# Patient Record
Sex: Male | Born: 1960 | Race: Black or African American | Hispanic: No | Marital: Single | Smoking: Former smoker
Health system: Southern US, Community
[De-identification: ages and names within clinical notes are randomized; demographics above are authoritative.]

## PROBLEM LIST (undated history)

## (undated) DIAGNOSIS — I639 Cerebral infarction, unspecified: Secondary | ICD-10-CM

## (undated) DIAGNOSIS — E119 Type 2 diabetes mellitus without complications: Secondary | ICD-10-CM

## (undated) DIAGNOSIS — I10 Essential (primary) hypertension: Secondary | ICD-10-CM

## (undated) HISTORY — PX: KNEE SURGERY: SHX244

---

## 2005-09-13 ENCOUNTER — Emergency Department: Payer: Self-pay | Admitting: Internal Medicine

## 2007-07-16 ENCOUNTER — Other Ambulatory Visit: Payer: Self-pay

## 2007-07-16 ENCOUNTER — Emergency Department: Payer: Self-pay | Admitting: Emergency Medicine

## 2009-06-02 ENCOUNTER — Emergency Department: Payer: Self-pay | Admitting: Emergency Medicine

## 2009-06-05 ENCOUNTER — Ambulatory Visit: Payer: Self-pay | Admitting: Orthopedic Surgery

## 2009-06-29 ENCOUNTER — Emergency Department: Payer: Self-pay | Admitting: Emergency Medicine

## 2015-04-09 ENCOUNTER — Encounter
Admission: RE | Disposition: A | Discharge status: Home / Self Care | Payer: Self-pay | Source: Ambulatory Visit | Attending: Internal Medicine

## 2015-04-09 ENCOUNTER — Ambulatory Visit
Admission: RE | Admit: 2015-04-09 | Discharge: 2015-04-09 | Disposition: A | Discharge status: Home / Self Care | Payer: Medicare Other | Source: Ambulatory Visit | Attending: Internal Medicine | Admitting: Internal Medicine

## 2015-04-09 ENCOUNTER — Encounter: Payer: Self-pay | Admitting: *Deleted

## 2015-04-09 DIAGNOSIS — Z87891 Personal history of nicotine dependence: Secondary | ICD-10-CM | POA: Diagnosis not present

## 2015-04-09 DIAGNOSIS — Z79899 Other long term (current) drug therapy: Secondary | ICD-10-CM | POA: Insufficient documentation

## 2015-04-09 DIAGNOSIS — I259 Chronic ischemic heart disease, unspecified: Secondary | ICD-10-CM | POA: Insufficient documentation

## 2015-04-09 DIAGNOSIS — I2510 Atherosclerotic heart disease of native coronary artery without angina pectoris: Secondary | ICD-10-CM | POA: Diagnosis present

## 2015-04-09 DIAGNOSIS — E785 Hyperlipidemia, unspecified: Secondary | ICD-10-CM | POA: Diagnosis not present

## 2015-04-09 DIAGNOSIS — E119 Type 2 diabetes mellitus without complications: Secondary | ICD-10-CM | POA: Diagnosis not present

## 2015-04-09 DIAGNOSIS — I25119 Atherosclerotic heart disease of native coronary artery with unspecified angina pectoris: Secondary | ICD-10-CM | POA: Diagnosis not present

## 2015-04-09 DIAGNOSIS — I10 Essential (primary) hypertension: Secondary | ICD-10-CM | POA: Diagnosis not present

## 2015-04-09 DIAGNOSIS — I200 Unstable angina: Secondary | ICD-10-CM | POA: Diagnosis present

## 2015-04-09 DIAGNOSIS — Z7982 Long term (current) use of aspirin: Secondary | ICD-10-CM | POA: Insufficient documentation

## 2015-04-09 DIAGNOSIS — E7800 Pure hypercholesterolemia, unspecified: Secondary | ICD-10-CM | POA: Diagnosis not present

## 2015-04-09 DIAGNOSIS — Z7984 Long term (current) use of oral hypoglycemic drugs: Secondary | ICD-10-CM | POA: Diagnosis not present

## 2015-04-09 DIAGNOSIS — R9439 Abnormal result of other cardiovascular function study: Secondary | ICD-10-CM | POA: Diagnosis not present

## 2015-04-09 HISTORY — DX: Essential (primary) hypertension: I10

## 2015-04-09 HISTORY — PX: CARDIAC CATHETERIZATION: SHX172

## 2015-04-09 HISTORY — DX: Type 2 diabetes mellitus without complications: E11.9

## 2015-04-09 HISTORY — DX: Cerebral infarction, unspecified: I63.9

## 2015-04-09 LAB — GLUCOSE, CAPILLARY: Glucose-Capillary: 164 mg/dL — ABNORMAL HIGH (ref 65–99)

## 2015-04-09 SURGERY — LEFT HEART CATH AND CORONARY ANGIOGRAPHY
Anesthesia: Moderate Sedation

## 2015-04-09 MED ORDER — IOHEXOL 300 MG/ML  SOLN
INTRAMUSCULAR | Status: DC | PRN
  Administered 2015-04-09: 160 mL via INTRA_ARTERIAL

## 2015-04-09 MED ORDER — MIDAZOLAM HCL 2 MG/2ML IJ SOLN
INTRAMUSCULAR | Status: AC
  Filled 2015-04-09: qty 2

## 2015-04-09 MED ORDER — SODIUM CHLORIDE 0.9 % IV SOLN
INTRAVENOUS | Status: DC
Start: 2015-04-09 — End: 2015-04-09
  Administered 2015-04-09: 12:00:00 via INTRAVENOUS

## 2015-04-09 MED ORDER — SODIUM CHLORIDE 0.9 % IV SOLN: 250.0000 mL | INTRAVENOUS | Status: DC | PRN

## 2015-04-09 MED ORDER — AMLODIPINE BESYLATE 5 MG PO TABS
ORAL_TABLET | ORAL | Status: AC
  Administered 2015-04-09: 10 mg
  Filled 2015-04-09: qty 2

## 2015-04-09 MED ORDER — CARVEDILOL 25 MG PO TABS
25.0000 mg | ORAL_TABLET | Freq: Two times a day (BID) | ORAL | Status: DC
  Administered 2015-04-09: 25 mg via ORAL
  Filled 2015-04-09 (×2): qty 1

## 2015-04-09 MED ORDER — FENTANYL CITRATE (PF) 100 MCG/2ML IJ SOLN
INTRAMUSCULAR | Status: AC
  Filled 2015-04-09: qty 2

## 2015-04-09 MED ORDER — HEPARIN (PORCINE) IN NACL 2-0.9 UNIT/ML-% IJ SOLN
INTRAMUSCULAR | Status: AC
  Filled 2015-04-09: qty 500

## 2015-04-09 MED ORDER — SODIUM CHLORIDE 0.9% FLUSH: 3.0000 mL | INTRAVENOUS | Status: DC | PRN

## 2015-04-09 MED ORDER — AMLODIPINE BESYLATE 10 MG PO TABS
10.0000 mg | ORAL_TABLET | Freq: Every day | ORAL | Status: DC
  Filled 2015-04-09 (×2): qty 1

## 2015-04-09 MED ORDER — LABETALOL HCL 5 MG/ML IV SOLN
INTRAVENOUS | Status: DC | PRN
  Administered 2015-04-09: 20 mg via INTRAVENOUS

## 2015-04-09 MED ORDER — FENTANYL CITRATE (PF) 100 MCG/2ML IJ SOLN
INTRAMUSCULAR | Status: DC | PRN
  Administered 2015-04-09: 25 ug via INTRAVENOUS

## 2015-04-09 MED ORDER — MIDAZOLAM HCL 2 MG/2ML IJ SOLN
INTRAMUSCULAR | Status: DC | PRN
  Administered 2015-04-09 (×2): 1 mg via INTRAVENOUS

## 2015-04-09 MED ORDER — SODIUM CHLORIDE 0.9% FLUSH: 3.0000 mL | Freq: Two times a day (BID) | INTRAVENOUS | Status: DC

## 2015-04-09 MED ORDER — LABETALOL HCL 5 MG/ML IV SOLN
INTRAVENOUS | Status: AC
  Filled 2015-04-09: qty 4

## 2015-04-09 SURGICAL SUPPLY — 15 items
CATH INFINITI 5 FR AL2 (CATHETERS) ×3 IMPLANT
CATH INFINITI 5 FR MPA2 (CATHETERS) ×3 IMPLANT
CATH INFINITI 5FR ANG PIGTAIL (CATHETERS) ×3 IMPLANT
CATH INFINITI 5FR JL4 (CATHETERS) ×3 IMPLANT
CATH INFINITI 5FR JL5 (CATHETERS) ×3 IMPLANT
CATH INFINITI JR4 5F (CATHETERS) ×3 IMPLANT
DEVICE CLOSURE MYNXGRIP 6/7F (Vascular Products) ×3 IMPLANT
KIT MANI 3VAL PERCEP (MISCELLANEOUS) ×3 IMPLANT
NEEDLE PERC 18GX7CM (NEEDLE) ×3 IMPLANT
PACK CARDIAC CATH (CUSTOM PROCEDURE TRAY) ×3 IMPLANT
SHEATH BRITE TIP 6FR X 23 (SHEATH) ×3 IMPLANT
SHEATH PINNACLE 5F 10CM (SHEATH) ×3 IMPLANT
SHEATH PINNACLE 6F 10CM (SHEATH) ×3 IMPLANT
WIRE EMERALD 3MM-J .035X150CM (WIRE) ×3 IMPLANT
WIRE EMERALD 3MM-J .035X260CM (WIRE) ×3 IMPLANT

## 2015-04-09 NOTE — Discharge Instructions (Signed)

## 2015-04-10 ENCOUNTER — Encounter: Payer: Self-pay | Admitting: Internal Medicine

## 2016-12-12 ENCOUNTER — Inpatient Hospital Stay
Admission: EM | Admit: 2016-12-12 | Discharge: 2016-12-13 | DRG: 065 | Disposition: A | Discharge status: Other Acute Inpatient Hospital | Payer: Medicare Other | Attending: Internal Medicine | Admitting: Internal Medicine

## 2016-12-12 ENCOUNTER — Emergency Department: Payer: Medicare Other

## 2016-12-12 DIAGNOSIS — G8194 Hemiplegia, unspecified affecting left nondominant side: Secondary | ICD-10-CM | POA: Diagnosis present

## 2016-12-12 DIAGNOSIS — I10 Essential (primary) hypertension: Secondary | ICD-10-CM | POA: Diagnosis present

## 2016-12-12 DIAGNOSIS — Z79899 Other long term (current) drug therapy: Secondary | ICD-10-CM

## 2016-12-12 DIAGNOSIS — W010XXA Fall on same level from slipping, tripping and stumbling without subsequent striking against object, initial encounter: Secondary | ICD-10-CM | POA: Diagnosis present

## 2016-12-12 DIAGNOSIS — I63531 Cerebral infarction due to unspecified occlusion or stenosis of right posterior cerebral artery: Secondary | ICD-10-CM | POA: Diagnosis not present

## 2016-12-12 DIAGNOSIS — Y92009 Unspecified place in unspecified non-institutional (private) residence as the place of occurrence of the external cause: Secondary | ICD-10-CM

## 2016-12-12 DIAGNOSIS — Z87891 Personal history of nicotine dependence: Secondary | ICD-10-CM

## 2016-12-12 DIAGNOSIS — H538 Other visual disturbances: Secondary | ICD-10-CM | POA: Diagnosis present

## 2016-12-12 DIAGNOSIS — R001 Bradycardia, unspecified: Secondary | ICD-10-CM | POA: Diagnosis present

## 2016-12-12 DIAGNOSIS — I639 Cerebral infarction, unspecified: Secondary | ICD-10-CM | POA: Diagnosis not present

## 2016-12-12 DIAGNOSIS — Z5309 Procedure and treatment not carried out because of other contraindication: Secondary | ICD-10-CM

## 2016-12-12 DIAGNOSIS — E785 Hyperlipidemia, unspecified: Secondary | ICD-10-CM | POA: Diagnosis present

## 2016-12-12 DIAGNOSIS — R414 Neurologic neglect syndrome: Secondary | ICD-10-CM | POA: Diagnosis present

## 2016-12-12 DIAGNOSIS — I671 Cerebral aneurysm, nonruptured: Secondary | ICD-10-CM | POA: Diagnosis present

## 2016-12-12 DIAGNOSIS — R29712 NIHSS score 12: Secondary | ICD-10-CM | POA: Diagnosis present

## 2016-12-12 DIAGNOSIS — Z7984 Long term (current) use of oral hypoglycemic drugs: Secondary | ICD-10-CM

## 2016-12-12 DIAGNOSIS — E119 Type 2 diabetes mellitus without complications: Secondary | ICD-10-CM | POA: Diagnosis present

## 2016-12-12 LAB — CBC WITH DIFFERENTIAL/PLATELET
Basophils Absolute: 0 10*3/uL (ref 0–0.1)
Basophils Relative: 0 %
Eosinophils Absolute: 0 10*3/uL (ref 0–0.7)
Eosinophils Relative: 0 %
HEMATOCRIT: 43.9 % (ref 40.0–52.0)
HEMOGLOBIN: 14.3 g/dL (ref 13.0–18.0)
LYMPHS ABS: 0.6 10*3/uL — AB (ref 1.0–3.6)
Lymphocytes Relative: 5 %
MCH: 25.3 pg — AB (ref 26.0–34.0)
MCHC: 32.6 g/dL (ref 32.0–36.0)
MCV: 77.6 fL — AB (ref 80.0–100.0)
MONO ABS: 0.6 10*3/uL (ref 0.2–1.0)
Monocytes Relative: 5 %
NEUTROS ABS: 11.8 10*3/uL — AB (ref 1.4–6.5)
NEUTROS PCT: 90 %
Platelets: 179 10*3/uL (ref 150–440)
RBC: 5.66 MIL/uL (ref 4.40–5.90)
RDW: 15.5 % — AB (ref 11.5–14.5)
WBC: 13.1 10*3/uL — ABNORMAL HIGH (ref 3.8–10.6)

## 2016-12-12 LAB — COMPREHENSIVE METABOLIC PANEL
ALT: 23 U/L (ref 17–63)
AST: 22 U/L (ref 15–41)
Albumin: 4.1 g/dL (ref 3.5–5.0)
Alkaline Phosphatase: 69 U/L (ref 38–126)
Anion gap: 11 (ref 5–15)
BILIRUBIN TOTAL: 1 mg/dL (ref 0.3–1.2)
BUN: 11 mg/dL (ref 6–20)
CALCIUM: 9.4 mg/dL (ref 8.9–10.3)
CO2: 26 mmol/L (ref 22–32)
Chloride: 103 mmol/L (ref 101–111)
Creatinine, Ser: 0.95 mg/dL (ref 0.61–1.24)
GLUCOSE: 164 mg/dL — AB (ref 65–99)
POTASSIUM: 3.7 mmol/L (ref 3.5–5.1)
Sodium: 140 mmol/L (ref 135–145)
Total Protein: 8 g/dL (ref 6.5–8.1)

## 2016-12-12 LAB — CK: Total CK: 161 U/L (ref 49–397)

## 2016-12-12 MED ORDER — ASPIRIN 81 MG PO CHEW
324.0000 mg | CHEWABLE_TABLET | Freq: Once | ORAL | Status: AC
  Administered 2016-12-12: 324 mg via ORAL
  Filled 2016-12-12: qty 4

## 2016-12-12 MED ORDER — LABETALOL HCL 5 MG/ML IV SOLN
10.0000 mg | Freq: Once | INTRAVENOUS | Status: DC
  Filled 2016-12-12: qty 4

## 2016-12-12 NOTE — ED Notes (Addendum)
Pt given po fluids and peanut butter crackers as allowed by Dr Mayford Knife; pt complaining about how long it's taking to "get my room"; pt understands admitting MD will be in ASAP; children remain at bedside;

## 2016-12-12 NOTE — ED Notes (Signed)
Patient transported to CT via stretcher with Jon Brown, radiology

## 2016-12-12 NOTE — ED Triage Notes (Addendum)
EMS pt from home after a neighbor saw him laying on the floor through a window at his house; pt can recall coming home from church around noon; says he went into the kitchen to make chicken and got dizzy/fell; pt unsure exactly how long he was on the floor; pt c/o pain to the right side of his head; says it's "like a hunger pain"; answering questions in complete coherent sentences; pt with right sided gaze and right sided weakness which is result of prior CVA; pt arrived incontinent of urine; EMS reported pt told them he has not been taking medications as ordered;

## 2016-12-12 NOTE — ED Notes (Signed)
In talking with pt while inserting IV, pt says he slipped and fell in the kitchen but was also dizzy; pt says he really has no idea what time it was, but he had gotten home from church around noon and went to cook chicken not much longer after that; pt says he can also recall laying on the floor, being unable to get up and had to void on himself;

## 2016-12-12 NOTE — ED Provider Notes (Addendum)
[facility] Emergency Department Provider Note       Time seen: ----------------------------------------- 9:24 PM on 12/12/2016 -----------------------------------------     I have reviewed the triage vital signs and the nursing notes.   HISTORY   Chief Complaint No chief complaint on file.    HPI Jon Brown is a 56 y.o. male with a history of diabetes, high blood pressure and CVA who presents to the ED for a fall. Patient was found on the floor of his home where he attempted to go to the bathroom. Patient denies any complaints at this time. He does a history of CVA with resulting left-sided weakness. Patient states he has stopped taking his pressure medication several months ago when he was found to be very hypertensive in route. He does describe a small frontal headache but otherwise denies complaints this time and arrives oriented.  Past Medical History:  Diagnosis Date  . Diabetes mellitus without complication (HCC)   . Hypertension   . Stroke [facility])     Patient Active Problem List   Diagnosis Date Noted  . Unstable angina (HCC) 04/09/2015    Past Surgical History:  Procedure Laterality Date  . CARDIAC CATHETERIZATION N/A 04/09/2015   Procedure: Left Heart Cath and Coronary Angiography;  Surgeon: Lamar Blinks, MD;  Location: [facility] INVASIVE CV LAB;  Service: Cardiovascular;  Laterality: N/A;  . KNEE SURGERY Bilateral     Allergies Patient has no known allergies.  Social History Social History  Substance Use Topics  . Smoking status: Former Smoker    Packs/day: 1.00    Years: 25.00    Types: Cigars  . Smokeless tobacco: Not on file  . Alcohol use Not on file    Review of Systems Constitutional: Negative for fever. Eyes: Negative for vision changes ENT:  Negative for congestion, sore throat Cardiovascular: Negative for chest pain. Respiratory: Negative for shortness of breath. Gastrointestinal: Negative for abdominal pain,  vomiting and diarrhea. Genitourinary: Negative for dysuria. Musculoskeletal: Negative for back pain. Skin: Negative for rash. Neurological: positive for headache, weakness  All systems negative/normal/unremarkable except as stated in the HPI  ____________________________________________   PHYSICAL EXAM:  VITAL SIGNS: ED Triage Vitals  Enc Vitals Group     BP      Pulse      Resp      Temp      Temp src      SpO2      Weight      Height      Head Circumference      Peak Flow      Pain Score      Pain Loc      Pain Edu?      Excl. in GC?     Constitutional: Alert and oriented. Well appearing and in no distress. Eyes: Conjunctivae are normal.right-sided gaze preference ENT   Head: Normocephalic and atraumatic.   Nose: No congestion/rhinnorhea.   Mouth/Throat: Mucous membranes are moist.   Neck: No stridor. Cardiovascular: Normal rate, regular rhythm. No murmurs, rubs, or gallops. Respiratory: Normal respiratory effort without tachypnea nor retractions. Breath sounds are clear and equal bilaterally. No wheezes/rales/rhonchi. Gastrointestinal: Soft and nontender. Normal bowel sounds Musculoskeletal: Nontender with normal range of motion in extremities. No lower extremity tenderness nor edema. Neurologic:  Normal speech and language. left-sided weakness is notedin the arm and the leg. Possible left-sided facial drooping is noted. Decreased sensation in left arm and left leg Skin:  Skin is warm, dry and  intact. No rash noted. Psychiatric: Mood and affect are normal. Speech and behavior are normal.  ____________________________________________  EKG: Interpreted by me.sinus rhythm rate of 76 bpm, LVH, left anterior fascicular block, normal QT  ____________________________________________  ED COURSE:  Pertinent labs & imaging results that were available during my care of the patient were reviewed by me and considered in my medical decision making (see chart for  details). Patient presents for weakness and a fall, we will assess with labs and imaging as indicated.   Procedures ____________________________________________   LABS (pertinent positives/negatives)  Labs Reviewed  CBC WITH DIFFERENTIAL/PLATELET - Abnormal; Notable for the following:       Result Value   WBC 13.1 (*)    MCV 77.6 (*)    MCH 25.3 (*)    RDW 15.5 (*)    Neutro Abs 11.8 (*)    Lymphs Abs 0.6 (*)    All other components within normal limits  COMPREHENSIVE METABOLIC PANEL - Abnormal; Notable for the following:    Glucose, Bld 164 (*)    All other components within normal limits  CK  URINALYSIS, COMPLETE (UACMP) WITH MICROSCOPIC  CBG MONITORING, ED   CRITICAL CARE Performed by: Emily Filbert   Total critical care time: 30 minutes  Critical care time was exclusive of separately billable procedures and treating other patients.  Critical care was necessary to treat or prevent imminent or life-threatening deterioration.  Critical care was time spent personally by me on the following activities: development of treatment plan with patient and/or surrogate as well as nursing, discussions with consultants, evaluation of patient's response to treatment, examination of patient, obtaining history from patient or surrogate, ordering and performing treatments and interventions, ordering and review of laboratory studies, ordering and review of radiographic studies, pulse oximetry and re-evaluation of patient's condition.  RADIOLOGY Images were viewed by me  CT head, chest x-ray IMPRESSION: 1. Possible acute ischemic changes demonstrated in the right posterior parietal and occipital region. Consider MRI for further evaluation. 2. 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 Additional 7 mL aneurysm of the basilar tip. 3. No acute intracranial hemorrhage. 4. Chronic  atrophy and small vessel ischemic changes. Wallerian degeneration on the right suggesting old infarcts. ____________________________________________  DIFFERENTIAL DIAGNOSIS   hypertensive encephalopathy, hypertensive urgency, CVA, electrolyte abnormality, rhabdomyolysis, MI, occult infection   FINAL ASSESSMENT AND PLAN  weakness, fall, CVA, hypertension   Plan: Patient had presented for a fall. Patients labs were reassuring with mild leukocytosis. Patients imaging was concerning for possible acute ischemic changes in the right parietal and occipital region. He does have untreated high blood pressure and it stopped taking all his medications. Patient will need to be admitted, started on anticoagulants and antihypertensive agents. I will discuss with the hospitalist for admission.   Emily Filbert, MD   Note: This note was generated in part or whole with voice recognition software. Voice recognition is usually quite accurate but there are transcription errors that can and very often do occur. I apologize for any typographical errors that were not detected and corrected.     Emily Filbert, MD 12/12/16 2251    Emily Filbert, MD 12/12/16 [phone]

## 2016-12-12 NOTE — ED Notes (Signed)
Pt assisted to repositioning to left side for comfort

## 2016-12-12 NOTE — ED Notes (Signed)
Son and daughter at bedside, Dr Mayford Knife in to speak with them

## 2016-12-12 NOTE — ED Notes (Signed)
Assisted primary RN with cleansing pt of incontinent urine and stool. Pt repositioned in bed, head of bed at 80 degrees, warm blankets provided.

## 2016-12-12 NOTE — ED Notes (Signed)
Dr williams at bedside. 

## 2016-12-13 ENCOUNTER — Encounter: Payer: Self-pay | Admitting: Emergency Medicine

## 2016-12-13 ENCOUNTER — Inpatient Hospital Stay: Payer: Medicare Other

## 2016-12-13 ENCOUNTER — Ambulatory Visit (HOSPITAL_COMMUNITY)
Admission: AD | Admit: 2016-12-13 | Discharge: 2016-12-13 | Disposition: A | Discharge status: Home / Self Care | Payer: Medicare Other | Source: Other Acute Inpatient Hospital | Attending: Neurosurgery | Admitting: Neurosurgery

## 2016-12-13 DIAGNOSIS — I639 Cerebral infarction, unspecified: Secondary | ICD-10-CM | POA: Diagnosis present

## 2016-12-13 DIAGNOSIS — I10 Essential (primary) hypertension: Secondary | ICD-10-CM | POA: Diagnosis present

## 2016-12-13 DIAGNOSIS — I671 Cerebral aneurysm, nonruptured: Secondary | ICD-10-CM | POA: Diagnosis present

## 2016-12-13 DIAGNOSIS — Z7984 Long term (current) use of oral hypoglycemic drugs: Secondary | ICD-10-CM | POA: Diagnosis not present

## 2016-12-13 DIAGNOSIS — E119 Type 2 diabetes mellitus without complications: Secondary | ICD-10-CM | POA: Diagnosis present

## 2016-12-13 DIAGNOSIS — Z79899 Other long term (current) drug therapy: Secondary | ICD-10-CM | POA: Diagnosis not present

## 2016-12-13 DIAGNOSIS — R29712 NIHSS score 12: Secondary | ICD-10-CM | POA: Diagnosis present

## 2016-12-13 DIAGNOSIS — R001 Bradycardia, unspecified: Secondary | ICD-10-CM | POA: Diagnosis present

## 2016-12-13 DIAGNOSIS — I63531 Cerebral infarction due to unspecified occlusion or stenosis of right posterior cerebral artery: Secondary | ICD-10-CM | POA: Diagnosis present

## 2016-12-13 DIAGNOSIS — W010XXA Fall on same level from slipping, tripping and stumbling without subsequent striking against object, initial encounter: Secondary | ICD-10-CM | POA: Diagnosis present

## 2016-12-13 DIAGNOSIS — R414 Neurologic neglect syndrome: Secondary | ICD-10-CM | POA: Diagnosis present

## 2016-12-13 DIAGNOSIS — Z87891 Personal history of nicotine dependence: Secondary | ICD-10-CM | POA: Diagnosis not present

## 2016-12-13 DIAGNOSIS — Y92009 Unspecified place in unspecified non-institutional (private) residence as the place of occurrence of the external cause: Secondary | ICD-10-CM | POA: Diagnosis not present

## 2016-12-13 DIAGNOSIS — G8194 Hemiplegia, unspecified affecting left nondominant side: Secondary | ICD-10-CM | POA: Diagnosis present

## 2016-12-13 DIAGNOSIS — E785 Hyperlipidemia, unspecified: Secondary | ICD-10-CM | POA: Diagnosis present

## 2016-12-13 DIAGNOSIS — H538 Other visual disturbances: Secondary | ICD-10-CM | POA: Diagnosis present

## 2016-12-13 LAB — URINALYSIS, COMPLETE (UACMP) WITH MICROSCOPIC
Bilirubin Urine: NEGATIVE
GLUCOSE, UA: 50 mg/dL — AB
Ketones, ur: 20 mg/dL — AB
LEUKOCYTES UA: NEGATIVE
Nitrite: NEGATIVE
PH: 5 (ref 5.0–8.0)
Protein, ur: >=300 mg/dL — AB
SPECIFIC GRAVITY, URINE: 1.025 (ref 1.005–1.030)

## 2016-12-13 LAB — GLUCOSE, CAPILLARY
GLUCOSE-CAPILLARY: 138 mg/dL — AB (ref 65–99)
GLUCOSE-CAPILLARY: 156 mg/dL — AB (ref 65–99)
GLUCOSE-CAPILLARY: 118 mg/dL — AB (ref 65–99)
GLUCOSE-CAPILLARY: 126 mg/dL — AB (ref 65–99)

## 2016-12-13 LAB — HEMOGLOBIN A1C
HEMOGLOBIN A1C: 6.8 % — AB (ref 4.8–5.6)
MEAN PLASMA GLUCOSE: 148.46 mg/dL

## 2016-12-13 LAB — LIPID PANEL
CHOL/HDL RATIO: 4.6 ratio
Cholesterol: 195 mg/dL (ref 0–200)
HDL: 42 mg/dL (ref >40)
LDL Cholesterol: 138 mg/dL — ABNORMAL HIGH (ref 0–99)
Triglycerides: 76 mg/dL (ref <150)
VLDL: 15 mg/dL (ref 0–40)

## 2016-12-13 MED ORDER — ASPIRIN 300 MG RE SUPP: 300.0000 mg | Freq: Every day | RECTAL | Status: DC

## 2016-12-13 MED ORDER — INSULIN ASPART 100 UNIT/ML ~~LOC~~ SOLN: 0.0000 [IU] | Freq: Three times a day (TID) | SUBCUTANEOUS | 11 refills | Status: AC

## 2016-12-13 MED ORDER — LISINOPRIL 20 MG PO TABS
20.0000 mg | ORAL_TABLET | Freq: Every day | ORAL | Status: DC
  Administered 2016-12-13: 20 mg via ORAL
  Filled 2016-12-13: qty 1

## 2016-12-13 MED ORDER — LORAZEPAM 2 MG/ML IJ SOLN: 0.5000 mg | Freq: Once | INTRAMUSCULAR | Status: DC

## 2016-12-13 MED ORDER — ACETAMINOPHEN 325 MG PO TABS
650.0000 mg | ORAL_TABLET | ORAL | Status: DC | PRN
  Administered 2016-12-13 (×2): 650 mg via ORAL
  Filled 2016-12-13 (×2): qty 2

## 2016-12-13 MED ORDER — STROKE: EARLY STAGES OF RECOVERY BOOK
Freq: Once | Status: AC
  Administered 2016-12-13: 03:00:00

## 2016-12-13 MED ORDER — INFLUENZA VAC SPLIT QUAD 0.5 ML IM SUSY: 0.5000 mL | PREFILLED_SYRINGE | INTRAMUSCULAR | Status: DC

## 2016-12-13 MED ORDER — ENOXAPARIN SODIUM 40 MG/0.4ML ~~LOC~~ SOLN: 40.0000 mg | SUBCUTANEOUS | Status: DC

## 2016-12-13 MED ORDER — INSULIN ASPART 100 UNIT/ML ~~LOC~~ SOLN
0.0000 [IU] | Freq: Three times a day (TID) | SUBCUTANEOUS | Status: DC
  Administered 2016-12-13: 3 [IU] via SUBCUTANEOUS
  Administered 2016-12-13: 2 [IU] via SUBCUTANEOUS
  Filled 2016-12-13 (×2): qty 1

## 2016-12-13 MED ORDER — INSULIN ASPART 100 UNIT/ML ~~LOC~~ SOLN: 0.0000 [IU] | Freq: Every day | SUBCUTANEOUS | 11 refills | Status: AC

## 2016-12-13 MED ORDER — ASPIRIN 325 MG PO TABS: 325.0000 mg | ORAL_TABLET | Freq: Every day | ORAL | Status: AC

## 2016-12-13 MED ORDER — PNEUMOCOCCAL VAC POLYVALENT 25 MCG/0.5ML IJ INJ: 0.5000 mL | INJECTION | INTRAMUSCULAR | Status: DC

## 2016-12-13 MED ORDER — ONDANSETRON HCL 4 MG/2ML IJ SOLN
4.0000 mg | Freq: Once | INTRAMUSCULAR | Status: AC
  Administered 2016-12-13: 4 mg via INTRAVENOUS

## 2016-12-13 MED ORDER — ONDANSETRON HCL 4 MG/2ML IJ SOLN
INTRAMUSCULAR | Status: AC
  Filled 2016-12-13: qty 2

## 2016-12-13 MED ORDER — INSULIN ASPART 100 UNIT/ML ~~LOC~~ SOLN: 0.0000 [IU] | Freq: Every day | SUBCUTANEOUS | Status: DC

## 2016-12-13 MED ORDER — ACETAMINOPHEN 160 MG/5ML PO SOLN
650.0000 mg | ORAL | Status: DC | PRN
  Filled 2016-12-13: qty 20.3

## 2016-12-13 MED ORDER — ACETAMINOPHEN 650 MG RE SUPP: 650.0000 mg | RECTAL | Status: DC | PRN

## 2016-12-13 MED ORDER — ASPIRIN 325 MG PO TABS
325.0000 mg | ORAL_TABLET | Freq: Every day | ORAL | Status: DC
  Administered 2016-12-13: 13:00:00 325 mg via ORAL
  Filled 2016-12-13 (×2): qty 1

## 2016-12-13 MED ORDER — SODIUM CHLORIDE 0.9 % IV SOLN
INTRAVENOUS | Status: DC
  Administered 2016-12-13: 03:00:00 via INTRAVENOUS

## 2016-12-13 MED ORDER — TRAZODONE HCL 50 MG PO TABS
100.0000 mg | ORAL_TABLET | Freq: Every day | ORAL | Status: DC
  Administered 2016-12-13: 100 mg via ORAL
  Filled 2016-12-13: qty 2

## 2016-12-13 MED ORDER — AMLODIPINE BESYLATE 10 MG PO TABS
10.0000 mg | ORAL_TABLET | Freq: Every day | ORAL | Status: DC
  Administered 2016-12-13: 10 mg via ORAL
  Filled 2016-12-13: qty 1

## 2016-12-13 MED ORDER — ATORVASTATIN CALCIUM 20 MG PO TABS
80.0000 mg | ORAL_TABLET | Freq: Every day | ORAL | Status: DC
  Administered 2016-12-13 (×2): 80 mg via ORAL
  Filled 2016-12-13 (×2): qty 4

## 2016-12-13 NOTE — Evaluation (Signed)
Physical Therapy Evaluation Patient Details Name: Jon Brown MRN: [id number] DOB: 1960/05/25 Today's Date: 12/13/2016   History of Present Illness  presented to ER with acute worsening of L -sided weakness, dizziness and fall (Found on floor); admitted with acute CVA. 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  Initial NIHSS 12 per neurologist.  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  Maintains head/neck in position of R cervical rotation; unable to mobilize past midline despite max cuing from therapist.  Currently requiring max/total assist +2 for all functional activities, demonstrating very little/no protective or spontaneous righting reactions.  Very poor/absent awareness of deficits. Would benefit from skilled PT to address above deficits and promote optimal return to PLOF; recommend transition to CIR upon discharge from acute hospitalization.     Follow Up Recommendations CIR    Equipment Recommendations       Recommendations for Other Services       Precautions / Restrictions Precautions Precautions: Fall Restrictions Weight Bearing Restrictions: No      Mobility  Bed Mobility Overal bed mobility: Needs Assistance Bed Mobility: Supine to Sit;Sit to Supine     Supine to sit: Max assist Sit to supine: Max assist;+2 for physical assistance   General bed mobility comments: assist for LE management, truncal elevation  Transfers Overall transfer level: Needs assistance Equipment used: 2 person hand held assist Transfers: Sit to/from Stand Sit to Stand: Max assist;+2 physical assistance         General transfer comment: extensive assist +2 for lift , off, standing balance; unable to achieve full  postural extension, patient spontaneously sitting due to fatigue  Ambulation/Gait             General Gait Details: unsafe/unable  Stairs            Wheelchair Mobility    Modified Rankin (Stroke Patients Only)       Balance Overall balance assessment: Needs assistance Sitting-balance support: No upper extremity supported Sitting balance-Leahy Scale: Poor Sitting balance - Comments: L posterior/lateral lean. intermittent verbalizes awareness of altered midline, but no attempts at self-correction    Standing balance support: Bilateral upper extremity supported Standing balance-Leahy Scale: Zero                               Pertinent Vitals/Pain Pain Assessment: No/denies pain    Home Living Family/patient expects to be discharged to:: Private residence Living Arrangements: Alone Available Help at Discharge: Family Type of Home: House Home Access: Stairs to enter Entrance Stairs-Rails: None Entrance Stairs-Number of Steps: 1 Home Layout: One level Home Equipment: Cane - single point      Prior Function Level of Independence: Independent with assistive device(s)         Comments: Pt reports ambulating with SPC, indep with ADL and driving.      Hand Dominance        Extremity/Trunk Assessment   Upper Extremity Assessment LUE Deficits / Details: LUE 0/5 flaccid, no awareness of L side, no sensation per OT    Lower Extremity Assessment LLE Deficits / Details: L LE grossly 0/5 throughout; absent sensation to light touch/deep pressure, some degree of flexor withdrawal to deep, painful stimuli (though patient with no cognitive awareness).  Positive Babinski L  LE, multi-beat (5-6 beat) non-sustained clonus L ankle.    Cervical / Trunk Assessment Cervical / Trunk Exceptions: maintains head/neck in significant rotation towards R; strong gaze preference to R with absent ability to cross midline with head or eyes this date  Communication    Communication: No difficulties  Cognition Arousal/Alertness: Awake/alert Behavior During Therapy: WFL for tasks assessed/performed Overall Cognitive Status: Impaired/Different from baseline                                 General Comments: absent awareness of deficits and need for assist with all functional activities.  Poor use of compensatory strategies.  Decreased processing, problem-solving.      General Comments      Exercises Other Exercises Other Exercises: Unsupported sitting balance edge of bed, working to promote awareness of midline orientation in A/P and M/L planes, min/mod assist throughout.  R UE propped on pillows to facilitate improved positioning.  Attempted to integrate visual scanning/tracking; patient with significant difficulty attending to sitting balance with addition of another activity Other Exercises: Educated patient on importance of visual scanning to L; encouraged to locate door/visitors into room.  REquires max/dep cuing; unable to demonstrate indep   Assessment/Plan    PT Assessment Patient needs continued PT services  PT Problem List Decreased strength;Decreased range of motion;Decreased activity tolerance;Decreased balance;Decreased mobility;Decreased coordination;Decreased cognition;Decreased knowledge of use of DME;Decreased safety awareness;Decreased knowledge of precautions;Impaired sensation;Obesity       PT Treatment Interventions DME instruction;Gait training;Stair training;Functional mobility training;Therapeutic activities;Therapeutic exercise;Balance training;Neuromuscular re-education;Cognitive remediation;Patient/family education;Wheelchair mobility training    PT Goals (Current goals can be found in the Care Plan section)  Acute Rehab PT Goals Patient Stated Goal: get better PT Goal Formulation: With patient Time For Goal Achievement: 12/27/16 Potential to Achieve Goals: Fair    Frequency 7X/week   Barriers to discharge  Decreased caregiver support      Co-evaluation PT/OT/SLP Co-Evaluation/Treatment: Yes Reason for Co-Treatment: Complexity of the patient's impairments (multi-system involvement);For patient/therapist safety;To address functional/ADL transfers PT goals addressed during session: Mobility/safety with mobility OT goals addressed during session: ADL's and self-care       AM-PAC PT "6 Clicks" Daily Activity  Outcome Measure Difficulty turning over in bed (including adjusting bedclothes, sheets and blankets)?: Unable Difficulty moving from lying on back to sitting on the side of the bed? : Unable Difficulty sitting down on and standing up from a chair with arms (e.g., wheelchair, bedside commode, etc,.)?: Unable Help needed moving to and from a bed to chair (including a wheelchair)?: Total Help needed walking in hospital room?: Total Help needed climbing 3-5 steps with a railing? : Total 6 Click Score: 6    End of Session Equipment Utilized During Treatment: Gait belt Activity Tolerance: Patient limited by fatigue Patient left: in bed;with bed alarm set;with call bell/phone within reach;with family/visitor present Nurse Communication: Mobility status PT Visit Diagnosis: Difficulty in walking, not elsewhere classified (R26.2);Muscle weakness (generalized) (M62.81);Hemiplegia and hemiparesis Hemiplegia - Right/Left: Left Hemiplegia - dominant/non-dominant: Non-dominant Hemiplegia - caused by: Cerebral infarction    Time: 1610-9604 PT Time Calculation (min) (ACUTE ONLY): 34 min   Charges:   PT Evaluation $PT Eval High Complexity: 1 High PT Treatments $Neuromuscular Re-education: 8-22 mins   PT G Codes:   PT G-Codes **NOT FOR INPATIENT CLASS** Functional Assessment Tool Used: AM-PAC 6 Clicks Basic Mobility Functional Limitation: Mobility: Walking and moving around Mobility: Walking and Moving Around Current Status (  [id number]): 100 percent impaired, limited or restricted Mobility:  Walking and Moving Around Goal Status [phone]): At least 20 percent but less than 40 percent impaired, limited or restricted    Robbin Loughmiller H. Manson Passey, PT, DPT, NCS 12/13/16, 9:58 PM [phone]

## 2016-12-13 NOTE — ED Notes (Signed)
Dr Mayford Knife says family has reported to him the weakness on pt's left side has worsened significantly; prior to today pt was able to ambulate with walker

## 2016-12-13 NOTE — ED Notes (Signed)
Pt transport to 124 

## 2016-12-13 NOTE — Evaluation (Signed)
Speech Language Pathology Evaluation Patient Details Name: Jon Brown MRN: [id number] DOB: 04-Oct-1960 Today's Date: 12/13/2016 Time: 1000-1100 SLP Time Calculation (min) (ACUTE ONLY): 60 min  Problem List:  Patient Active Problem List   Diagnosis Date Noted  . CVA (cerebral vascular accident) (HCC) 12/13/2016  . Unstable angina (HCC) 04/09/2015   Past Medical History:  Past Medical History:  Diagnosis Date  . Diabetes mellitus without complication (HCC)   . Hypertension   . Stroke [facility])    Past Surgical History:  Past Surgical History:  Procedure Laterality Date  . CARDIAC CATHETERIZATION N/A 04/09/2015   Procedure: Left Heart Cath and Coronary Angiography;  Surgeon: Lamar Blinks, MD;  Location: [facility] INVASIVE CV LAB;  Service: Cardiovascular;  Laterality: N/A;  . KNEE SURGERY Bilateral    HPI:   is a 56 y.o. male with a known history of Diabetes mellitus type 2, hypertension, CVA with left hemiparesis presented to the emergency room with fall and increased weakness on the left side since 12 PM yesterday afternoon. Patient noticed increased weakness in the left upper and lower extremity since 12 PM yesterday afternoon. He also said he initially had some difficulty in speech. He also complained of some tingling sensation in the left upper extremity. Patient was found on the floor in his home when he attempted to go to the bathroom. He lost balance and fell down. Patient also stopped taking his blood pressure medication for last couple couple of months, he has elevated blood pressure at presentation in the emergency room. Hospitalist service was consulted for stroke workup. He was worked up with CT head which showed acute ischemic changes in the right posterior parietal and occipital region.   Assessment / Plan / Recommendation Clinical Impression  Pt was seen for Cognitive-Linguistic assessment and given the Western Aphasia Battery Screener at bedside. Pt's family were present  in room when ST entered. Pt's speech appeared mumbled w/ a low volume intensity, which family reported was baseline - pt edentulous leaving his dentures at home. Family reported that a previous CVA (~5 years prior) had impacted pt's speech also. Pt's vision was directed toward the right side of the room, however, if asked pt could turn his head towards the left side of the room though noted eyes were unable to follow and maintain focus - tended to return to deviation to the right. When asked about his vision, pt says it does not affect his ADLs and that "I watch tv and the tv watches me". Pt seemed unaware if the change in his vision was new or old as he stated he had had trouble w/ his vision for "~1 year now". Noted cool, flacid RUE. Of note, pt stated he did not finish High School and was not reading at home prior to this admission. Using the Western Aphasia Battery Screener, the pt was evaluated in areas of spontaneous speech: content and fluency, verbal auditory comprehension, sequential commands, repetition, object naming, and apraxia. Pt was not assessed in reading or writing because the pt did not complete high school and was not able/did not want to complete those tasks. Pt stated he "did not write at home" and "did not tell time". Vision deficits could certainly impact his reading ability/accuracy even a single word level. During tasks of spontaneous speech: content, the pt obtained a score of 6 out of 10. Pt was able to answer how he was feeling and why he was in the hospital. However, pt was not able to give his  full address - he could only name the city, and he could not describe a picture given to him w/ multiple details (could be d/t vision deficits). During tasks of spontaneous speech: fluency, pt scored a 9 out of 10 c/b some hesitations and word-finding difficulty. Pt appears to have poor breath support for speaking c/b chunking his utterances into few word phrases.  During tasks of automatic  speech, pt was able to count 1-5 and say the days of the week, however, it was slightly unintelligible d/t low volume intensity and mumbling. Once the pt was cued to speak loud and articulate, pt's intelligibility increased. Pt's son reported that pt speaks more clearly w/ his dentures in, but he does not have them at the hospital. During tasks of auditory verbal comprehension, pt scored a 9 out of 10. Pt was able to answer 9 yes/no questions correctly, such as "is your name Katrinka Blazing?" and "will paper burn in a fire?". Pt missed the question, "do you eat a banana before you peel it?".  During tasks of sequential commands and repetition, pt achieved 100% accuracy. Pt was able to point to/pick up/place multiple objects on a table as long as it was in his field of vision (towards the right side of the table). Pt was able to repeat single words, phrases, and sentences after the ST.  During tasks of object naming, pt scored a 9 out of 10. Pt was able to name objects such as toothbrush, toothpaste, comb, pencil, battery, and washcloth without cues, however, he needed a semantic cue for "razor". During tasks of apraxia, pt scored a 10 out of 10. He was able to follow the commands from the ST and act out each one, including wave goodbye, pretend to use a toothbrush, and pretend to knock at a door and open it.   With the results of the Western Aphasia Battery Screener today and family indicating he seemed "at his baseline except his speech is mumbled some", recommend any further ST services be at the next facility to evaluate pt's abilities during ADLs to see if there is a change from his baseline abilities; use of dentures in place for speech intelligibility then. Recommend cueing pt to increase volume intensity and over-articulation to increase intelligibility. Recommend f/u w/ evaluation of Cognitive status and safety awareness and judgement d/t pt living at home alone - pt appears to have reduced awareness of his  current deficits. D/t presentation of visual field deficits, recommend f/u w/ Ophthalmologist/MD consult; Neurology. Recommend dietician f/u as well. (Pt/family and NSG denied any swallowing difficulties.) Recommend standing on pt's Left side to encourage stimulation and Left field gaze awareness.ST services will be available for any further education while pt is admitted. MD/NSG updated.     SLP Assessment  SLP Recommendation/Assessment: All further Speech Lanaguage Pathology  needs can be addressed in the next venue of care (recommend pt engage in ADLs ) SLP Visit Diagnosis: Cognitive communication deficit (R41.841)    Follow Up Recommendations  Skilled Nursing facility    Frequency and Duration     n/a      SLP Evaluation Cognition  Overall Cognitive Status: Difficult to assess (family reported old CVA, but pt living independently) Arousal/Alertness: Awake/alert Orientation Level: Oriented X4 Attention: Sustained Sustained Attention: Appears intact Memory: Impaired Memory Impairment: Retrieval deficit Awareness: Appears intact Problem Solving: Appears intact Executive Function: Sequencing;Decision Making Sequencing: Impaired Sequencing Impairment: Verbal complex Decision Making: Appears intact Behaviors: Impulsive Safety/Judgment: Appears intact [employer] of Cognitive  Functioning: Automatic/appropriate       Comprehension  Auditory Comprehension Overall Auditory Comprehension: Appears within functional limits for tasks assessed Yes/No Questions: Impaired Basic Biographical Questions: 76-100% accurate Basic Immediate Environment Questions: 75-100% accurate Complex Questions: 50-74% accurate Commands: Impaired One Step Basic Commands: 75-100% accurate Two Step Basic Commands: 75-100% accurate (needs cueing; repeated instructions) Multistep Basic Commands: 75-100% accurate (repeated instructions multiple times) Conversation: Simple Interfering Components:  Visual impairments;Processing speed (pt appeated to have visual deficits) EffectiveTechniques: Extra processing time;Repetition;Pausing Visual Recognition/Discrimination Discrimination: Not tested Reading Comprehension Reading Status: Not tested    Expression Expression Primary Mode of Expression: Verbal Verbal Expression Overall Verbal Expression: Appears within functional limits for tasks assessed Initiation: No impairment Automatic Speech: Name;Counting;Day of week (cued to be loud; mumbled unless cued) Level of Generative/Spontaneous Verbalization: Sentence Repetition: No impairment Naming: No impairment Pragmatics: No impairment Interfering Components: Speech intelligibility (pt has low volume and poor breath support) Effective Techniques: Semantic cues Non-Verbal Means of Communication: Not applicable Written Expression Dominant Hand: Right Written Expression: Not tested   Oral / Motor  Oral Motor/Sensory Function Overall Oral Motor/Sensory Function: Within functional limits Motor Speech Overall Motor Speech: Impaired at baseline (family reported) Respiration: Impaired Level of Impairment: Sentence Phonation: Low vocal intensity Resonance: Within functional limits Articulation: Impaired Level of Impairment: Word (pt did not have dentures in) Intelligibility: Intelligibility reduced Word: 75-100% accurate Phrase: 50-74% accurate Sentence: 50-74% accurate Conversation: 50-74% accurate Motor Planning: Witnin functional limits Motor Speech Errors: Not applicable Interfering Components: Premorbid status;Inadequate dentition (dentures at home) Effective Techniques: Over-articulate;Increased vocal intensity   GO                    Nancy Fetter, SLP-Graduate Student Nancy Fetter 12/13/2016, 3:47 PM   This information has been reviewed and agreed upon by this supervising clinician.  This patient note, response to treatment and overall treatment plan has been  reviewed and this clinician agrees with the information provided.  12/13/16, 4:39 PM [phone] Jerilynn Som, MS, CCC-SLP

## 2016-12-13 NOTE — ED Notes (Signed)
Pt to [place] before transfer to floor; radiology to call when pt is almost finished for transport to inpt

## 2016-12-13 NOTE — NC FL2 (Signed)
[place] MEDICAID FL2 LEVEL OF CARE SCREENING TOOL     IDENTIFICATION  Patient Name: Jon Brown Birthdate: 1961-02-01 Sex: male Admission Date (Current Location): 12/12/2016  [place] and [place] Number:  Chiropodist and Address:  [facility], [address], [place], [place] [address]      Provider Number: [id number]  Attending Physician Name and Address:  Alford Highland, MD  Relative Name and Phone Number:       Current Level of Care: Hospital Recommended Level of Care: Skilled Nursing Facility Prior Approval Number:    Date Approved/Denied:   PASRR Number:  [id number])  Discharge Plan: SNF    Current Diagnoses: Patient Active Problem List   Diagnosis Date Noted  . CVA (cerebral vascular accident) (HCC) 12/13/2016  . Unstable angina (HCC) 04/09/2015    Orientation RESPIRATION BLADDER Height & Weight     Self, Time, Situation, Place  Normal Continent Weight: 271 lb 11.2 oz (123.2 kg) Height:   (172.7 cm)  BEHAVIORAL SYMPTOMS/MOOD NEUROLOGICAL BOWEL NUTRITION STATUS      Continent Diet (Diet: Heart Healthy )  AMBULATORY STATUS COMMUNICATION OF NEEDS Skin   Extensive Assist Verbally Normal                       Personal Care Assistance Level of Assistance  Bathing, Feeding, Dressing Bathing Assistance: Limited assistance Feeding assistance: Independent Dressing Assistance: Limited assistance     Functional Limitations Info  Sight, Hearing, Speech Sight Info: Adequate Hearing Info: Adequate Speech Info: Adequate    SPECIAL CARE FACTORS FREQUENCY  PT (By licensed PT), OT (By licensed OT)     PT Frequency:  (5) OT Frequency:  (5)            Contractures      Additional Factors Info  Code Status, Allergies Code Status Info:  (Full Code. ) Allergies Info:  (No Known Allergies. )           Current Medications (12/13/2016):  This is the current hospital active medication list Current  Facility-Administered Medications  Medication Dose Route Frequency Provider Last Rate Last Dose  . 0.9 %  sodium chloride infusion   Intravenous Continuous Ihor Austin, MD 50 mL/hr at 12/13/16 0705    . acetaminophen (TYLENOL) tablet 650 mg  650 mg Oral Q4H PRN Ihor Austin, MD   650 mg at 12/13/16 0831   Or  . acetaminophen (TYLENOL) solution 650 mg  650 mg Per Tube Q4H PRN Ihor Austin, MD       Or  . acetaminophen (TYLENOL) suppository 650 mg  650 mg Rectal Q4H PRN Pyreddy, Vivien Rota, MD      . amLODipine (NORVASC) tablet 10 mg  10 mg Oral Daily Pyreddy, Vivien Rota, MD   10 mg at 12/13/16 0831  . aspirin suppository 300 mg  300 mg Rectal Daily Pyreddy, Pavan, MD       Or  . aspirin tablet 325 mg  325 mg Oral Daily Pyreddy, Vivien Rota, MD   325 mg at 12/13/16 1233  . atorvastatin (LIPITOR) tablet 80 mg  80 mg Oral Daily Pyreddy, Vivien Rota, MD   80 mg at 12/13/16 0253  . enoxaparin (LOVENOX) injection 40 mg  40 mg Subcutaneous Q24H Wieting, Richard, MD      . Melene Muller ON 12/14/2016] Influenza vac split quadrivalent PF (FLUARIX) injection 0.5 mL  0.5 mL Intramuscular Tomorrow-1000 Pyreddy, Pavan, MD      . insulin aspart (novoLOG) injection 0-15 Units  0-15 Units Subcutaneous TID WC Ihor Austin, MD   3 Units at 12/13/16 0831  . insulin aspart (novoLOG) injection 0-5 Units  0-5 Units Subcutaneous QHS Pyreddy, Pavan, MD      . lisinopril (PRINIVIL,ZESTRIL) tablet 20 mg  20 mg Oral Daily Pyreddy, Vivien Rota, MD   20 mg at 12/13/16 0831  . [START ON 12/14/2016] pneumococcal 23 valent vaccine (PNU-IMMUNE) injection 0.5 mL  0.5 mL Intramuscular Tomorrow-1000 Pyreddy, Pavan, MD      . traZODone (DESYREL) tablet 100 mg  100 mg Oral QHS Ihor Austin, MD   100 mg at 12/13/16 6962     Discharge Medications: Please see discharge summary for a list of discharge medications.  Relevant Imaging Results:  Relevant Lab Results:   Additional Information  (SSN: [id number])  Naser Schuld, Darleen Crocker, LCSW

## 2016-12-13 NOTE — Progress Notes (Signed)
Report called to [place], [facility] RN Pecolia Ades.  [facility] transporting to [facility] 1610.  NAD, no needs at time of transfer.

## 2016-12-13 NOTE — Consult Note (Signed)
Referring Physician: Renae Gloss    Chief Complaint: Worsening left sided weakness  HPI: Jon Brown is an 56 y.o. male with a history of stroke and residual left hemiparesis who presents with worsening left sided weakness noted after a fall.  Patient unable to give complete history and family not available therefore much of the history is obtained from the chart.  Patient was found on the floor after falling when he attempted to go to the bathroom.  Unclear time of fall.   Patient has been off his antihypertensives and reports that his doctor stopped his ASA.   Reports previously walking with a cane.   Initial NIHSS of 12.    Date last known well: Date: 12/12/2016 Time last known well: Time: 12:00 tPA Given: No: Outside tine window  Past Medical History:  Diagnosis Date  . Diabetes mellitus without complication (HCC)   . Hypertension   . Stroke [facility])     Past Surgical History:  Procedure Laterality Date  . CARDIAC CATHETERIZATION N/A 04/09/2015   Procedure: Left Heart Cath and Coronary Angiography;  Surgeon: Lamar Blinks, MD;  Location: [facility] INVASIVE CV LAB;  Service: Cardiovascular;  Laterality: N/A;  . KNEE SURGERY Bilateral     Family History  Problem Relation Age of Onset  . Cancer Father   . Hypertension Neg Hx   . Diabetes Mellitus II Neg Hx    Social History:  reports that he has quit smoking. His smoking use included Cigars. He has a 25.00 pack-year smoking history. He has never used smokeless tobacco. He reports that he does not drink alcohol or use drugs.  Allergies: No Known Allergies  Medications:  I have reviewed the patient's current medications. Prior to Admission:  Prescriptions Prior to Admission  Medication Sig Dispense Refill Last Dose  . amLODipine (NORVASC) 10 MG tablet Take 10 mg by mouth daily.   Past Month at Unknown time  . atorvastatin (LIPITOR) 80 MG tablet Take 80 mg by mouth daily.   Past Month at Unknown time  . carvedilol (COREG) 25 MG  tablet Take 25 mg by mouth 2 (two) times daily with a meal.   Past Month at Unknown time  . glipiZIDE (GLUCOTROL XL) 10 MG 24 hr tablet Take 10 mg by mouth daily with breakfast.     . lisinopril (PRINIVIL,ZESTRIL) 20 MG tablet Take 1 tablet by mouth daily.     Marland Kitchen loratadine (CLARITIN) 10 MG tablet Take 10 mg by mouth daily.   Past Month at Unknown time  . metFORMIN (GLUCOPHAGE) 1000 MG tablet Take 1 tablet by mouth 2 (two) times daily.     . traZODone (DESYREL) 100 MG tablet Take 100 mg by mouth at bedtime.   Past Month at Unknown time   Scheduled: . amLODipine  10 mg Oral Daily  . aspirin  300 mg Rectal Daily   Or  . aspirin  325 mg Oral Daily  . atorvastatin  80 mg Oral Daily  . [START ON 12/14/2016] Influenza vac split quadrivalent PF  0.5 mL Intramuscular Tomorrow-1000  . insulin aspart  0-15 Units Subcutaneous TID WC  . insulin aspart  0-5 Units Subcutaneous QHS  . lisinopril  20 mg Oral Daily  . [START ON 12/14/2016] pneumococcal 23 valent vaccine  0.5 mL Intramuscular Tomorrow-1000  . traZODone  100 mg Oral QHS    ROS: History obtained from the patient  General ROS: negative for - chills, fatigue, fever, night sweats, weight gain or weight loss Psychological ROS:  negative for - behavioral disorder, hallucinations, memory difficulties, mood swings or suicidal ideation Ophthalmic ROS: negative for - blurry vision, double vision, eye pain or loss of vision ENT ROS: negative for - epistaxis, nasal discharge, oral lesions, sore throat, tinnitus or vertigo Allergy and Immunology ROS: negative for - hives or itchy/watery eyes Hematological and Lymphatic ROS: negative for - bleeding problems, bruising or swollen lymph nodes Endocrine ROS: negative for - galactorrhea, hair pattern changes, polydipsia/polyuria or temperature intolerance Respiratory ROS: negative for - cough, hemoptysis, shortness of breath or wheezing Cardiovascular ROS: negative for - chest pain, dyspnea on exertion,  edema or irregular heartbeat Gastrointestinal ROS: negative for - abdominal pain, diarrhea, hematemesis, nausea/vomiting or stool incontinence Genito-Urinary ROS: negative for - dysuria, hematuria, incontinence or urinary frequency/urgency Musculoskeletal ROS: back pain Neurological ROS: as noted in HPI Dermatological ROS: negative for rash and skin lesion changes  Physical Examination: Blood pressure (!) 188/98, pulse (!) 58, temperature 99.6 F (37.6 C), temperature source Oral, resp. rate 18, height  (1.727 m), weight 123.2 kg (271 lb 11.2 oz), SpO2 93 %.  HEENT-  Normocephalic, no lesions, without obvious abnormality.  Normal external eye and conjunctiva.  Normal TM's bilaterally.  Normal auditory canals and external ears. Normal external nose, mucus membranes and septum.  Normal pharynx. Cardiovascular- S1, S2 normal, pulses palpable throughout   Lungs- chest clear, no wheezing, rales, normal symmetric air entry Abdomen- soft, non-tender; bowel sounds normal; no masses,  no organomegaly Extremities- no edema Lymph-no adenopathy palpable Musculoskeletal-no joint tenderness, deformity or swelling Skin-warm and dry, no hyperpigmentation, vitiligo, or suspicious lesions  Neurological Examination   Mental Status: Alert, oriented, thought content appropriate.  Speech fluent without evidence of aphasia.  Able to follow simple commands without difficulty. Cranial Nerves: II: Discs flat bilaterally; LHH, pupils equal, round, reactive to light and accommodation III,IV, VI: ptosis not present, right gaze preference.  Unable to go beyond midline to the left V,VII: left facial droop, facial light touch sensation normal bilaterally VIII: hearing normal bilaterally IX,X: gag reflex present XI: shoulder shrug decreased on the left XII: midline tongue extension Motor: Right : Upper extremity   5/5    Left:     Upper extremity   0/5  Lower extremity   5/5     Lower extremity   2-3/5 Tone  and bulk:normal tone throughout; no atrophy noted Sensory: Pinprick and light touch intact throughout, bilaterally Deep Tendon Reflexes: 1+ and symmetric with absent AJ's bilaterally Plantars: Right: downgoing   Left: upgoing Cerebellar: Normal finger-to-nose using the RUE Gait: not tested due to safety concerns    Laboratory Studies:  Basic Metabolic Panel:  Recent Labs Lab 12/12/16 2143  NA 140  K 3.7  CL 103  CO2 26  GLUCOSE 164*  BUN 11  CREATININE 0.95  CALCIUM 9.4    Liver Function Tests:  Recent Labs Lab 12/12/16 2143  AST 22  ALT 23  ALKPHOS 69  BILITOT 1.0  PROT 8.0  ALBUMIN 4.1   No results for input(s): LIPASE, AMYLASE in the last 168 hours. No results for input(s): AMMONIA in the last 168 hours.  CBC:  Recent Labs Lab 12/12/16 2143  WBC 13.1*  NEUTROABS 11.8*  HGB 14.3  HCT 43.9  MCV 77.6*  PLT 179    Cardiac Enzymes:  Recent Labs Lab 12/12/16 2143  CKTOTAL 161    BNP: Invalid input(s): POCBNP  CBG:  Recent Labs Lab 12/13/16 0235 12/13/16 0744 12/13/16 1230  GLUCAP 138* 156*  118*    Microbiology: No results found for this or any previous visit.  Coagulation Studies: No results for input(s): LABPROT, INR in the last 72 hours.  Urinalysis:  Recent Labs Lab 12/13/16 0253  COLORURINE YELLOW*  LABSPEC 1.025  PHURINE 5.0  GLUCOSEU 50*  HGBUR SMALL*  BILIRUBINUR NEGATIVE  KETONESUR 20*  PROTEINUR >=300*  NITRITE NEGATIVE  LEUKOCYTESUR NEGATIVE    Lipid Panel:    Component Value Date/Time   CHOL 195 12/13/2016 0505   TRIG 76 12/13/2016 0505   HDL 42 12/13/2016 0505   CHOLHDL 4.6 12/13/2016 0505   VLDL 15 12/13/2016 0505   LDLCALC 138 (H) 12/13/2016 0505    HgbA1C:  Lab Results  Component Value Date   HGBA1C 6.8 (H) 12/13/2016    Urine Drug Screen:  No results found for: LABOPIA, COCAINSCRNUR, LABBENZ, AMPHETMU, THCU, LABBARB  Alcohol Level: No results for input(s): ETH in the last 168  hours.   Imaging: Dg Eye Foreign Body  Result Date: 12/13/2016 CLINICAL DATA:  Metal working/exposure; clearance prior to MRI EXAM: ORBITS FOR FOREIGN BODY - 2 VIEW COMPARISON:  None. FINDINGS: There is no evidence of metallic foreign body within the orbits. No significant bone abnormality identified. IMPRESSION: No evidence of metallic foreign body within the orbits. Electronically Signed   By: Obie Dredge M.D.   On: 12/13/2016 11:18   Dg Chest 1 View  Result Date: 12/12/2016 CLINICAL DATA:  56 y/o  M; altered mental status. EXAM: CHEST 1 VIEW COMPARISON:  07/16/2007 chest radiograph. FINDINGS: Patient is rotated. Enlarged cardiac silhouette. The prominent pulmonary markings probably represents mild interstitial edema. No pleural effusion or pneumothorax. Bones are unremarkable. IMPRESSION: Cardiomegaly and interstitial pulmonary edema. Electronically Signed   By: Mitzi Hansen M.D.   On: 12/12/2016 22:00   Ct Head Wo Contrast  Result Date: 12/12/2016 CLINICAL DATA:  Patient got dizzy and fell and was found lying on the floor. Pain in the right side of the head. Right-sided gaze which is result of an old stroke. EXAM: CT HEAD WITHOUT CONTRAST TECHNIQUE: Contiguous axial images were obtained from the base of the skull through the vertex without intravenous contrast. COMPARISON:  07/16/2007 FINDINGS: Brain: Diffuse cerebral atrophy. Mild ventricular dilatation consistent with central atrophy. Low-attenuation changes in the deep white matter consistent with small vessel ischemia. There is loss of distinction of gray-white matter junction in the right posterior parietal and occipital region with some effacement of sulci. This may indicate changes of acute infarct. Consider MRI for further evaluation if clinically indicated. Wallerian degeneration of the right brainstem suggesting old infarcts. Old lacunar infarcts in the thalamus. No midline shift. No abnormal extra-axial fluid  collections. Basal cisterns are not effaced. No acute intracranial hemorrhage. 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 Diameter of the aneurysm measures up to about 2 cm. This represents a significant increase from the previous study at which time it measured 9 mm. The aneurysm displaces the pons and midbrain towards the right and compresses the left cerebral peduncle. There is also an aneurysm of the basilar tip measuring about 7 mm diameter without change. Basilar artery is calcified and tortuous. Calcification of the internal carotid artery's as well. Skull: The calvarium appears intact. Sinuses/Orbits: Mild mucosal thickening in the paranasal sinuses. No acute air-fluid levels. Mastoid air cells are clear. Other: None. IMPRESSION: 1. Possible acute ischemic changes demonstrated in the right posterior parietal and occipital region.  Consider MRI for further evaluation. 2. 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 Additional 7 mL aneurysm of the basilar tip. 3. No acute intracranial hemorrhage. 4. Chronic atrophy and small vessel ischemic changes. Wallerian degeneration on the right suggesting old infarcts. Electronically Signed   By: Burman Nieves M.D.   On: 12/12/2016 22:46   US Carotid Bilateral (at [facility] Only)  Result Date: 12/13/2016 CLINICAL DATA:  Stroke. Hypertension. Syncope. Hyperlipidemia. Diabetes. EXAM: BILATERAL CAROTID DUPLEX ULTRASOUND TECHNIQUE: Wallace Cullens scale imaging, color Doppler and duplex ultrasound were performed of bilateral carotid and vertebral arteries in the neck. COMPARISON:  None. FINDINGS: Criteria: Quantification of carotid stenosis is based on velocity parameters that correlate the residual internal carotid diameter with NASCET-based stenosis levels, using the diameter of the distal internal  carotid lumen as the denominator for stenosis measurement. The following velocity measurements were obtained: RIGHT ICA:  66/8 cm/sec CCA:  96/9 cm/sec SYSTOLIC ICA/CCA RATIO:  0.7 DIASTOLIC ICA/CCA RATIO:  0.9 ECA:  60 cm/sec LEFT The patient terminated the examination. The left carotid circulation was not evaluated. RIGHT CAROTID ARTERY: Tortuous right carotid artery. Normal appearance of the carotid bifurcation. No significant calcific plaque formation. Flow is demonstrated throughout the carotid bifurcation on color flow Doppler imaging without significant turbulence. Normal waveforms are obtained. No findings to suggest significant stenosis. RIGHT VERTEBRAL ARTERY: Right vertebral artery was not evaluated due to patient terminating the examination. LEFT CAROTID ARTERY:  Not evaluated. LEFT VERTEBRAL ARTERY:  Not evaluated. IMPRESSION: Examination was limited to the right carotid circulation due to patient terminated the examination prior to completion. No evidence of hemodynamically significant stenosis of the right internal carotid artery. Electronically Signed   By: Burman Nieves M.D.   On: 12/13/2016 02:43   Mr Brain Limited Wo Contrast  Result Date: 12/13/2016 CLINICAL DATA:  Stroke follow-up EXAM: MRI HEAD WITHOUT CONTRAST TECHNIQUE: Multiplanar, multiecho pulse sequences of the brain and surrounding structures were obtained without intravenous contrast. COMPARISON:  Head CT earlier today FINDINGS: Partial study due to patient condition. Only sagittal T1 and axial diffusion imaging was acquired. Large confluent infarct in the right occipital lobe and posterior temporal lobe. There is contiguous infarct of a right thalamoperforator into the posterior internal capsule and corona radiata. Small acute infarct in the right cerebellum. DWI hyperintensity in the inferior left frontal lobe is likely artifact from the skullbase. No infarct seen in this area on previous head CT. There is severe chronic small  vessel ischemia with diffuse white matter gliosis and volume loss. Superimposed lacunes are present. There is known fusiform aneurysmal enlargement of the basilar which was better evaluated by previous head CT given the limited MRI. On sagittal T1 weighted imaging the aneurysm measures 19 mm in diameter. IMPRESSION: 1. Partial study due to patient condition, only sagittal T1 and axial diffusion imaging was acquired. 2. Acute posterior circulation infarcts affecting the right cerebellum and right PCA distribution as described. 3. 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 4. Severe chronic small vessel ischemia. Electronically Signed   By: Marnee Spring M.D.   On: 12/13/2016 12:22    Assessment: 56 y.o. male with history of stroke on no antiplatelet therapy at home who presents after a fall with worsening left sided weakness.  MRI of the brain reviewed and shows an acute right PCA infarct.  Acute infarct noted in the right cerebellum as  well.  Concern is for embolic etiology.  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  Carotid doppler study was incomplete but showed  no evidence of hemodynamically significant stenosis in the right ICA.  Echocardiogram pending.  A1c 6.8, LDL 138.  Stroke Risk Factors - diabetes mellitus, hyperlipidemia and hypertension  Plan: 1. Aggressive lipid management with target LDL<70.   2. NSg consult for evaluation of aneurysm and need for further vascular imaging.   3. PT consult, OT consult, Speech consult 4. Echocardiogram pending 5. Prophylactic therapy-ASA  and Plaivx  daily 6. NPO until RN stroke swallow screen 7. Telemetry monitoring 8. Frequent neuro checks   Thana Farr, MD Neurology [phone] 12/13/2016, 1:35 PM

## 2016-12-13 NOTE — Discharge Summary (Signed)
[employer] - [place] at [facility]   PATIENT NAME: Jon Brown    MR#:  [id number]  DATE OF BIRTH:  29-Apr-1960  DATE OF ADMISSION:  12/12/2016 ADMITTING PHYSICIAN: Ihor Austin, MD  DATE OF DISCHARGE: 12/13/2016  PRIMARY CARE PHYSICIAN: [facility]    ADMISSION DIAGNOSIS:  CVA (cerebral vascular accident) [facility] [I63.9] Essential hypertension [I10] Cerebrovascular accident (CVA), unspecified mechanism (HCC) [I63.9]  DISCHARGE DIAGNOSIS:  Active Problems:   CVA (cerebral vascular accident) (HCC)   SECONDARY DIAGNOSIS:   Past Medical History:  Diagnosis Date  . Diabetes mellitus without complication (HCC)   . Hypertension   . Stroke [facility])     HOSPITAL COURSE:   1.  Acute posterior circulation infarcts affecting the right cerebellum and right PCA distribution. The patient does have a left-sided neglect and left-sided paralysis. I was unable to get him to move anything on his left side. Did not withdrawal to painful stimuli. The patient did not look past the midline to the left.  Patient started on aspirin 325 mg daily. Appreciate neurology consultation. Already on high-dose atorvastatin. 2.  Large basilar artery aneurysm. Case discussed with neurosurgeon on call and he recommended transfer to a tertiary care center because large aneurysms can form a clot and could be the cause of the stroke.  Case discussed with [facility] neurosurgery Dr. Eula Listen and patient was accepted to [facility] when bed available.  MRA of the brain secondary to patient agitation In the machine. Potentially we'll try later this afternoon. 3. Type 2  Diabetes mellitus. Hemoglobin A1c 6.8.Continue metfomin. hold glipizide. Put on sliding scale for now. 4. Hyperlipidemia unspecified on high-dose atorvastatin. LDL 138. Goal would likely be less than 100. 5. Bradycardia hold Coreg 6. Accelerated hypertension. Allow permissive hypertension in setting of acute stroke.  Continue his Norvasc for now.   DISCHARGE CONDITIONS:   fair  CONSULTS OBTAINED:  Treatment Team:  Thana Farr, MD  DRUG ALLERGIES:  No Known Allergies  DISCHARGE MEDICATIONS:   Current Discharge Medication List    START taking these medications   Details  aspirin 325 MG tablet Take 1 tablet (325 mg total) by mouth daily.    !! insulin aspart (NOVOLOG) 100 UNIT/ML injection Inject 0-15 Units into the skin 3 (three) times daily with meals. Qty: 10 mL, Refills: 11    !! insulin aspart (NOVOLOG) 100 UNIT/ML injection Inject 0-5 Units into the skin at bedtime. Qty: 10 mL, Refills: 11     !! - Potential duplicate medications found. Please discuss with provider.    CONTINUE these medications which have NOT CHANGED   Details  amLODipine (NORVASC) 10 MG tablet Take 10 mg by mouth daily.    atorvastatin (LIPITOR) 80 MG tablet Take 80 mg by mouth daily.    lisinopril (PRINIVIL,ZESTRIL) 20 MG tablet Take 1 tablet by mouth daily.    loratadine (CLARITIN) 10 MG tablet Take 10 mg by mouth daily.    metFORMIN (GLUCOPHAGE) 1000 MG tablet Take 1 tablet by mouth 2 (two) times daily.    traZODone (DESYREL) 100 MG tablet Take 100 mg by mouth at bedtime.      STOP taking these medications     carvedilol (COREG) 25 MG tablet      glipiZIDE (GLUCOTROL XL) 10 MG 24 hr tablet          DISCHARGE INSTRUCTIONS:   Follow-up [facility] neurosurgical team in one day  If you experience worsening of your admission symptoms, develop shortness of  breath, life threatening emergency, suicidal or homicidal thoughts you must seek medical attention immediately by calling 911 or calling your MD immediately  if symptoms less severe.  You Must read complete instructions/literature along with all the possible adverse reactions/side effects for all the Medicines you take and that have been prescribed to you. Take any new Medicines after you have completely understood and accept all the possible adverse  reactions/side effects.   Please note  You were cared for by a hospitalist during your hospital stay. If you have any questions about your discharge medications or the care you received while you were in the hospital after you are discharged, you can call the unit and asked to speak with the hospitalist on call if the hospitalist that took care of you is not available. Once you are discharged, your primary care physician will handle any further medical issues. Please note that NO REFILLS for any discharge medications will be authorized once you are discharged, as it is imperative that you return to your primary care physician (or establish a relationship with a primary care physician if you do not have one) for your aftercare needs so that they can reassess your need for medications and monitor your lab values.    Today   CHIEF COMPLAINT:   Chief Complaint  Patient presents with  . Dizziness  . Fall    HISTORY OF PRESENT ILLNESS:  Jon Brown  is a 56 y.o. male with a known history of previous stroke presents with dizziness and fall   VITAL SIGNS:  Blood pressure (!) 188/98, pulse (!) 58, temperature 99.6 F (37.6 C), temperature source Oral, resp. rate 18, height  (1.727 m), weight 123.2 kg (271 lb 11.2 oz), SpO2 93 %.   PHYSICAL EXAMINATION:  GENERAL:  56 y.o.-year-old patient lying in the bed with no acute distress.  EYES: Pupils equal, round, reactive to light and accommodation. No scleral icterus. Patient unable to look to the left HEENT: Head atraumatic, normocephalic. Oropharynx and nasopharynx clear.  NECK:  Supple, no jugular venous distention. No thyroid enlargement, no tenderness.  LUNGS: Normal breath sounds bilaterally, no wheezing, rales,rhonchi or crepitation. No use of accessory muscles of respiration.  CARDIOVASCULAR: S1, S2 bradycardic. No murmurs, rubs, or gallops.  ABDOMEN: Soft, non-tender, non-distended. Bowel sounds present. No organomegaly or mass.   EXTREMITIES: No pedal edema, cyanosis, or clubbing.  NEUROLOGIC: left-sided neglect. Patient unable to move his eyes to the left. Left-sided paralysis. PSYCHIATRIC: The patient is alert and answers questions.  SKIN: No obvious rash, lesion, or ulcer.   DATA REVIEW:   CBC  Recent Labs Lab 12/12/16 2143  WBC 13.1*  HGB 14.3  HCT 43.9  PLT 179    Chemistries   Recent Labs Lab 12/12/16 2143  NA 140  K 3.7  CL 103  CO2 26  GLUCOSE 164*  BUN 11  CREATININE 0.95  CALCIUM 9.4  AST 22  ALT 23  ALKPHOS 69  BILITOT 1.0      RADIOLOGY:  Dg Eye Foreign Body  Result Date: 12/13/2016 CLINICAL DATA:  Metal working/exposure; clearance prior to MRI EXAM: ORBITS FOR FOREIGN BODY - 2 VIEW COMPARISON:  None. FINDINGS: There is no evidence of metallic foreign body within the orbits. No significant bone abnormality identified. IMPRESSION: No evidence of metallic foreign body within the orbits. Electronically Signed   By: Obie Dredge M.D.   On: 12/13/2016 11:18   Dg Chest 1 View  Result Date: 12/12/2016 CLINICAL DATA:  56 y/o  M; altered mental status. EXAM: CHEST 1 VIEW COMPARISON:  07/16/2007 chest radiograph. FINDINGS: Patient is rotated. Enlarged cardiac silhouette. The prominent pulmonary markings probably represents mild interstitial edema. No pleural effusion or pneumothorax. Bones are unremarkable. IMPRESSION: Cardiomegaly and interstitial pulmonary edema. Electronically Signed   By: Mitzi Hansen M.D.   On: 12/12/2016 22:00   Ct Head Wo Contrast  Result Date: 12/12/2016 CLINICAL DATA:  Patient got dizzy and fell and was found lying on the floor. Pain in the right side of the head. Right-sided gaze which is result of an old stroke. EXAM: CT HEAD WITHOUT CONTRAST TECHNIQUE: Contiguous axial images were obtained from the base of the skull through the vertex without intravenous contrast. COMPARISON:  07/16/2007 FINDINGS: Brain: Diffuse cerebral atrophy. Mild  ventricular dilatation consistent with central atrophy. Low-attenuation changes in the deep white matter consistent with small vessel ischemia. There is loss of distinction of gray-white matter junction in the right posterior parietal and occipital region with some effacement of sulci. This may indicate changes of acute infarct. Consider MRI for further evaluation if clinically indicated. Wallerian degeneration of the right brainstem suggesting old infarcts. Old lacunar infarcts in the thalamus. No midline shift. No abnormal extra-axial fluid collections. Basal cisterns are not effaced. No acute intracranial hemorrhage. 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 Diameter of the aneurysm measures up to about 2 cm. This represents a significant increase from the previous study at which time it measured 9 mm. The aneurysm displaces the pons and midbrain towards the right and compresses the left cerebral peduncle. There is also an aneurysm of the basilar tip measuring about 7 mm diameter without change. Basilar artery is calcified and tortuous. Calcification of the internal carotid artery's as well. Skull: The calvarium appears intact. Sinuses/Orbits: Mild mucosal thickening in the paranasal sinuses. No acute air-fluid levels. Mastoid air cells are clear. Other: None. IMPRESSION: 1. Possible acute ischemic changes demonstrated in the right posterior parietal and occipital region. Consider MRI for further evaluation. 2. 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 Additional 7 mL aneurysm of the basilar tip. 3. No acute intracranial hemorrhage. 4. Chronic atrophy and small vessel ischemic changes. Wallerian degeneration on the right suggesting old infarcts. Electronically Signed   By: Burman Nieves M.D.   On: 12/12/2016 22:46   US Carotid  Bilateral (at [facility])  Result Date: 12/13/2016 CLINICAL DATA:  Stroke. Hypertension. Syncope. Hyperlipidemia. Diabetes. EXAM: BILATERAL CAROTID DUPLEX ULTRASOUND TECHNIQUE: Wallace Cullens scale imaging, color Doppler and duplex ultrasound were performed of bilateral carotid and vertebral arteries in the neck. COMPARISON:  None. FINDINGS: Criteria: Quantification of carotid stenosis is based on velocity parameters that correlate the residual internal carotid diameter with NASCET-based stenosis levels, using the diameter of the distal internal carotid lumen as the denominator for stenosis measurement. The following velocity measurements were obtained: RIGHT ICA:  66/8 cm/sec CCA:  96/9 cm/sec SYSTOLIC ICA/CCA RATIO:  0.7 DIASTOLIC ICA/CCA RATIO:  0.9 ECA:  60 cm/sec LEFT The patient terminated the examination. The left carotid circulation was not evaluated. RIGHT CAROTID ARTERY: Tortuous right carotid artery. Normal appearance of the carotid bifurcation. No significant calcific plaque formation. Flow is demonstrated throughout the carotid bifurcation on color flow Doppler imaging without significant turbulence. Normal waveforms are obtained. No findings to suggest significant stenosis. RIGHT VERTEBRAL  ARTERY: Right vertebral artery was not evaluated due to patient terminating the examination. LEFT CAROTID ARTERY:  Not evaluated. LEFT VERTEBRAL ARTERY:  Not evaluated. IMPRESSION: Examination was limited to the right carotid circulation due to patient terminated the examination prior to completion. No evidence of hemodynamically significant stenosis of the right internal carotid artery. Electronically Signed   By: Burman Nieves M.D.   On: 12/13/2016 02:43   Mr Brain Limited Wo Contrast  Result Date: 12/13/2016 CLINICAL DATA:  Stroke follow-up EXAM: MRI HEAD WITHOUT CONTRAST TECHNIQUE: Multiplanar, multiecho pulse sequences of the brain and surrounding structures were obtained without intravenous contrast.  COMPARISON:  Head CT earlier today FINDINGS: Partial study due to patient condition. Only sagittal T1 and axial diffusion imaging was acquired. Large confluent infarct in the right occipital lobe and posterior temporal lobe. There is contiguous infarct of a right thalamoperforator into the posterior internal capsule and corona radiata. Small acute infarct in the right cerebellum. DWI hyperintensity in the inferior left frontal lobe is likely artifact from the skullbase. No infarct seen in this area on previous head CT. There is severe chronic small vessel ischemia with diffuse white matter gliosis and volume loss. Superimposed lacunes are present. There is known fusiform aneurysmal enlargement of the basilar which was better evaluated by previous head CT given the limited MRI. On sagittal T1 weighted imaging the aneurysm measures 19 mm in diameter. IMPRESSION: 1. Partial study due to patient condition, only sagittal T1 and axial diffusion imaging was acquired. 2. Acute posterior circulation infarcts affecting the right cerebellum and right PCA distribution as described. 3. 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 4. Severe chronic small vessel ischemia. Electronically Signed   By: Marnee Spring M.D.   On: 12/13/2016 12:22      Management plans discussed with the patient, family and they are in agreement. Case discussed with son at the bedside this afternoon about transfer to [facility].  CODE STATUS:     Code Status Orders        Start     Ordered   12/13/16 0132  Full code  Continuous     12/13/16 0131    Code Status History    Date Active Date Inactive Code Status Order ID Comments User Context   This patient has a current code status but no historical code status.      TOTAL TIME TAKING CARE OF THIS PATIENT: 35 minutes.    Alford Highland M.D on 12/13/2016 at 3:34 PM  Between 7am to 6pm - Pager - [phone]  After 6pm go to [email or web address] - Air traffic controller  [employer] Office  [phone]  CC: Primary care physician; [facility]

## 2016-12-13 NOTE — Evaluation (Signed)
Occupational Therapy Evaluation Patient Details Name: Jon Brown MRN: [id number] DOB: [birth year] Today's Date: 12/13/2016    History of Present Illness 56 y.o.malewith history of stroke on no antiplatelet therapy at home who presents after a fall with worsening left sided weakness. MRI of the brain reviewed and shows an acute right PCA infarct. Acute infarct noted in the right cerebellum as well. Concern is for embolic etiology. 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 Carotid doppler study was incomplete but showed no evidence of hemodynamically significant stenosis in the right ICA. Echocardiogram pending.   Clinical Impression   Pt is a 56y.o. male who was admitted for worsening L sided weakness after a fall. MRI + for acute infarct. Pt. presents with LUE and LLE flaccid with no active volitional movement elicited, no sensation, + clonus and + Babinski test in LLE, no awareness of L side with right side gaze preference and able to come to midline with max verbal cues with head turn and eyes but unable to turn head or eyes past midline to the left. Pt with significantly impaired awareness of deficits, stating that he could get by with help from an aide for ADLs, cooking and cleaning at home with current functional status. Upon attempt, pt requires max assist x1 for bed mobility and unable to come to standing with max assist x2 from OT/PT. Pt was able to more actively perform lateral scoots in bed to right side with verbal cues and min assist x2. Pt would benefit from acute inpatient rehab services to work on LUE neuro muscular re-ed, visual scanning and awareness, therapeutic exercises, positioning, and pt./family education.  LUE positioned on pillow to support safety.     Follow Up Recommendations  CIR    Equipment Recommendations  Other (comment) (TBD at next venue of care)    Recommendations for Other Services Rehab consult     Precautions / Restrictions  Precautions Precautions: Fall Restrictions Weight Bearing Restrictions: No      Mobility Bed Mobility Overal bed mobility: Needs Assistance Bed Mobility: Supine to Sit;Sit to Supine     Supine to sit: Max assist;HOB elevated Sit to supine: Max assist   General bed mobility comments: max assist for BLE mgt and for trunk stabilization  Transfers Overall transfer level: Needs assistance   Transfers: Lateral/Scoot Transfers;Sit to/from Stand Sit to Stand: Max assist;Total assist;+2 physical assistance        Lateral/Scoot Transfers: +2 safety/equipment;Min assist General transfer comment: unable to safely complete max assist x2 sit to stand transfer; able to perform lateral scoots with min assist x2 and verbal cues for safety and sequencing    Balance Overall balance assessment: Needs assistance Sitting-balance support: Single extremity supported;Feet supported Sitting balance-Leahy Scale: Poor Sitting balance - Comments: poor + sitting balance with posterior and L lateral lean requiring verbal cues and occasional tactile cues and min assist to correct     Standing balance-Leahy Scale: Zero                             ADL either performed or assessed with clinical judgement   ADL Overall ADL's : Needs assistance/impaired Eating/Feeding: Set up;Supervision/ safety;Cueing for compensatory techinques;Bed level Eating/Feeding Details (indicate cue type and reason): would require set up of meal on mid- to right side of visual field, verbal cues to attend to L side of tray Grooming: Bed level;Set up;Wash/dry face Grooming Details (indicate cue type and reason): VCs to  perform grooming and incorporate L side using RUE Upper Body Bathing: Maximal assistance;Moderate assistance;Bed level   Lower Body Bathing: Maximal assistance;Bed level   Upper Body Dressing : Bed level;Moderate assistance;Maximal assistance   Lower Body Dressing: Maximal assistance;Bed level    Toilet Transfer: Maximal assistance;+2 for physical assistance Toilet Transfer Details (indicate cue type and reason): max assist x2 to attempt sit to stand transfer, unable to safely complete           General ADL Comments: ADL skills significantly impaired by L side neglect     Vision Baseline Vision/History: No visual deficits Patient Visual Report: No change from baseline Vision Assessment?: Yes Ocular Range of Motion: Restricted on the left (will not turn eyes to L past midline) Alignment/Gaze Preference: Gaze right (with significant verbal and visual cues pt will bring alignment/gaze to midline) Tracking/Visual Pursuits: Requires cues, head turns, or add eye shifts to track;Decreased smoothness of horizontal tracking (tracks to midline, cannot track to L without cues, head turns) Visual Fields: Left homonymous hemianopsia;Left visual field deficit     Perception     Praxis      Pertinent Vitals/Pain Pain Assessment: No/denies pain Pain Score: 8  Pain Location: headache Pain Intervention(s):  (RN notified)     Hand Dominance Right   Extremity/Trunk Assessment Upper Extremity Assessment Upper Extremity Assessment: LUE deficits/detail (RUE WFL) LUE Deficits / Details: LUE 0/5 flaccid, no awareness of L side, no sensation LUE Sensation: decreased light touch;decreased proprioception LUE Coordination: decreased fine motor;decreased gross motor   Lower Extremity Assessment Lower Extremity Assessment: Defer to PT evaluation;LLE deficits/detail LLE Deficits / Details: LLE 0/5 flaccid, + clonus, + Babinski testing, no sensation  LLE Sensation: decreased light touch;decreased proprioception LLE Coordination: decreased fine motor;decreased gross motor   Cervical / Trunk Assessment Cervical / Trunk Assessment: Other exceptions;Normal Cervical / Trunk Exceptions: R gaze preference, won't turn head past midline   Communication Communication Communication: No difficulties    Cognition Arousal/Alertness: Awake/alert Behavior During Therapy: WFL for tasks assessed/performed Overall Cognitive Status: Impaired/Different from baseline Area of Impairment: Safety/judgement;Problem solving               Rancho Levels of Cognitive Functioning Rancho Mirant Scales of Cognitive Functioning: Automatic/appropriate         Safety/Judgement: Decreased awareness of safety;Decreased awareness of deficits   Problem Solving: Slow processing General Comments: significantly decreased awareness of L sided deficits, follows simple commands consistently   General Comments       Exercises     Shoulder Instructions      Home Living Family/patient expects to be discharged to:: Private residence Living Arrangements: Alone Available Help at Discharge: Family (pt says he has family who could help, but unclear to what extent) Type of Home: House Home Access:  (threshold )     Home Layout: One level         Firefighter: Standard     Home Equipment: Cane - single point      Lives With: Alone    Prior Functioning/Environment Level of Independence: Independent with assistive device(s)        Comments: Pt reports ambulating with SPC, indep with ADL and driving.         OT Problem List: Decreased strength;Decreased cognition;Impaired sensation;Impaired tone;Decreased safety awareness;Decreased activity tolerance;Impaired balance (sitting and/or standing);Decreased knowledge of use of DME or AE;Impaired UE functional use;Decreased knowledge of precautions;Impaired vision/perception      OT Treatment/Interventions: Self-care/ADL training;Therapeutic exercise;Therapeutic activities;Neuromuscular education;Cognitive remediation/compensation;Visual/perceptual remediation/compensation;Energy  conservation;Patient/family education;DME and/or AE instruction;Balance training    OT Goals(Current goals can be found in the care plan section) Acute Rehab OT  Goals Patient Stated Goal: get better OT Goal Formulation: With patient Time For Goal Achievement: 12/27/16 Potential to Achieve Goals: Fair  OT Frequency: Min 3X/week   Barriers to D/C: Decreased caregiver support          Co-evaluation PT/OT/SLP Co-Evaluation/Treatment: Yes Reason for Co-Treatment: Complexity of the patient's impairments (multi-system involvement);For patient/therapist safety;To address functional/ADL transfers PT goals addressed during session: Mobility/safety with mobility;Balance OT goals addressed during session: ADL's and self-care      AM-PAC PT "6 Clicks" Daily Activity     Outcome Measure Help from another person eating meals?: Total (NPO at this time) Help from another person taking care of personal grooming?: A Lot Help from another person toileting, which includes using toliet, bedpan, or urinal?: Total Help from another person bathing (including washing, rinsing, drying)?: A Lot Help from another person to put on and taking off regular upper body clothing?: A Lot Help from another person to put on and taking off regular lower body clothing?: A Lot 6 Click Score: 10   End of Session Equipment Utilized During Treatment: Gait belt  Activity Tolerance: Patient tolerated treatment well Patient left: in bed;with call bell/phone within reach;with bed alarm set;with family/visitor present  OT Visit Diagnosis: Other abnormalities of gait and mobility (R26.89);Hemiplegia and hemiparesis;Other symptoms and signs involving cognitive function Hemiplegia - Right/Left: Left Hemiplegia - dominant/non-dominant: Non-Dominant Hemiplegia - caused by: Cerebral infarction                Time: 1610-9604 OT Time Calculation (min): 26 min Charges:  OT General Charges $OT Visit: 1 Visit OT Evaluation $OT Eval Moderate Complexity: 1 Mod G-Codes: OT G-codes **NOT FOR INPATIENT CLASS** Functional Assessment Tool Used: AM-PAC 6 Clicks Daily Activity;Clinical  judgement Functional Limitation: Self care Self Care Current Status (V4098): At least 80 percent but less than 100 percent impaired, limited or restricted Self Care Goal Status (J1914): At least 60 percent but less than 80 percent impaired, limited or restricted   Richrd Prime, MPH, MS, OTR/L ascom [phone] 12/13/16, 4:24 PM

## 2016-12-13 NOTE — Progress Notes (Signed)
Rehab Admissions Coordinator Note:  Patient was screened by Jon Brown for appropriateness for an Inpatient Acute Rehab Consult per OT recommendation. Noted pt to be transferred to [facility] when bed available. I will hold on my recommendations at this time.Jon Brown 12/13/2016, 4:34 PM  I can be reached at [phone].

## 2016-12-13 NOTE — ED Notes (Addendum)
Pt's daughter used call bell; pt vomiting; pt assisted more to side and vomiting green liquid; linens changed, clean gown in place and order for nausea medication obtained

## 2016-12-13 NOTE — ED Notes (Signed)
Family remains at bedside; informed it could be a fair amount of time until admitting MD is in to see pt; family with no requests at this time; pt repositioned again for comfort

## 2016-12-13 NOTE — H&P (Signed)
[facility] [place] at [facility]   PATIENT NAME: Jon Brown    MR#:  [id number]  DATE OF BIRTH:  10/28/1960  DATE OF ADMISSION:  12/12/2016  PRIMARY CARE PHYSICIAN: [facility]   REQUESTING/REFERRING PHYSICIAN:   CHIEF COMPLAINT:   Chief Complaint  Patient presents with  . Dizziness  . Fall    HISTORY OF PRESENT ILLNESS: Jon Brown  is a 56 y.o. male with a known history of Diabetes mellitus type 2, hypertension, CVA with left hemiparesis presented to the emergency room with fall and increased weakness on the left side since 12 PM yesterday afternoon. Patient noticed increased weakness in the left upper and lower extremity since 12 PM yesterday afternoon. He also said he initially had some difficulty in speech. He also complained of some tingling sensation in the left upper extremity. Patient was found on the floor in his home when he attempted to go to the bathroom. He lost balance and fell down. Patient also stopped taking his blood pressure medication for last couple couple of months, he has elevated blood pressure at presentation in the emergency room. Hospitalist service was consulted for stroke workup. He was worked up with CT head which showed acute ischemic changes in the right posterior parietal and occipital region.  PAST MEDICAL HISTORY:   Past Medical History:  Diagnosis Date  . Diabetes mellitus without complication (HCC)   . Hypertension   . Stroke [facility])     PAST SURGICAL HISTORY: Past Surgical History:  Procedure Laterality Date  . CARDIAC CATHETERIZATION N/A 04/09/2015   Procedure: Left Heart Cath and Coronary Angiography;  Surgeon: Lamar Blinks, MD;  Location: [facility] INVASIVE CV LAB;  Service: Cardiovascular;  Laterality: N/A;  . KNEE SURGERY Bilateral     SOCIAL HISTORY:  Social History  Substance Use Topics  . Smoking status: Former Smoker    Packs/day: 1.00    Years: 25.00    Types: Cigars   . Smokeless tobacco: Never Used  . Alcohol use No    FAMILY HISTORY:  Family History  Problem Relation Age of Onset  . Cancer Father   . Hypertension Neg Hx   . Diabetes Mellitus II Neg Hx     DRUG ALLERGIES: No Known Allergies  REVIEW OF SYSTEMS:   CONSTITUTIONAL: No fever, fatigue or weakness.  EYES: has blurry vision EARS, NOSE, AND THROAT: No tinnitus or ear pain.  RESPIRATORY: No cough, shortness of breath, wheezing or hemoptysis.  CARDIOVASCULAR: No chest pain, orthopnea, edema.  GASTROINTESTINAL: No nausea, vomiting, diarrhea or abdominal pain.  GENITOURINARY: No dysuria, hematuria.  ENDOCRINE: No polyuria, nocturia,  HEMATOLOGY: No anemia, easy bruising or bleeding SKIN: No rash or lesion. MUSCULOSKELETAL: No joint pain or arthritis.   NEUROLOGIC: tingling left upper extremity Increased weakness left upper and left lower extremity PSYCHIATRY: No anxiety or depression.   MEDICATIONS AT HOME:  Prior to Admission medications   Medication Sig Start Date End Date Taking? Authorizing Provider  amLODipine (NORVASC) 10 MG tablet Take 10 mg by mouth daily.   Yes [provider]  atorvastatin (LIPITOR) 80 MG tablet Take 80 mg by mouth daily.   Yes [provider]  carvedilol (COREG) 25 MG tablet Take 25 mg by mouth 2 (two) times daily with a meal.   Yes [provider]  glipiZIDE (GLUCOTROL XL) 10 MG 24 hr tablet Take 10 mg by mouth daily with breakfast.   Yes [provider]  lisinopril (PRINIVIL,ZESTRIL)  20 MG tablet Take 1 tablet by mouth daily. 11/08/16  Yes [provider]  loratadine (CLARITIN) 10 MG tablet Take 10 mg by mouth daily.   Yes [provider]  metFORMIN (GLUCOPHAGE) 1000 MG tablet Take 1 tablet by mouth 2 (two) times daily. 11/08/16  Yes [provider]  traZODone (DESYREL) 100 MG tablet Take 100 mg by mouth at bedtime.   Yes [provider]      PHYSICAL EXAMINATION:   VITAL SIGNS:  Blood pressure (!) 165/87, pulse 84, temperature 97.9 F (36.6 C), resp. rate 20, height  (1.727 m), weight 127 kg (280 lb), SpO2 93 %.  GENERAL:  56 y.o.-year-old patient lying in the bed with no acute distress.  EYES: Pupils equal, round, reactive to light and accommodation. No scleral icterus. Extraocular muscles intact.  HEENT: Head atraumatic, normocephalic. Oropharynx and nasopharynx clear.  NECK:  Supple, no jugular venous distention. No thyroid enlargement, no tenderness.  LUNGS: Normal breath sounds bilaterally, no wheezing, rales,rhonchi or crepitation. No use of accessory muscles of respiration.  CARDIOVASCULAR: S1, S2 normal. No murmurs, rubs, or gallops.  ABDOMEN: Soft, nontender, nondistended. Bowel sounds present. No organomegaly or mass.  EXTREMITIES: No pedal edema, cyanosis, or clubbing.  NEUROLOGIC: Cranial nerves II through XII are intact. Muscle strength 5/5 in right upper and right lower extremities. Sensation intact. Gait not checked.  Left hemiplegia noted PSYCHIATRIC: The patient is alert and oriented x 3.  SKIN: No obvious rash, lesion, or ulcer.   LABORATORY PANEL:   CBC  Recent Labs Lab 12/12/16 2143  WBC 13.1*  HGB 14.3  HCT 43.9  PLT 179  MCV 77.6*  MCH 25.3*  MCHC 32.6  RDW 15.5*  LYMPHSABS 0.6*  MONOABS 0.6  EOSABS 0.0  BASOSABS 0.0   ------------------------------------------------------------------------------------------------------------------  Chemistries   Recent Labs Lab 12/12/16 2143  NA 140  K 3.7  CL 103  CO2 26  GLUCOSE 164*  BUN 11  CREATININE 0.95  CALCIUM 9.4  AST 22  ALT 23  ALKPHOS 69  BILITOT 1.0   ------------------------------------------------------------------------------------------------------------------ estimated creatinine clearance is 111.4 mL/min (by C-G formula based on SCr of 0.95  mg/dL). ------------------------------------------------------------------------------------------------------------------ No results for input(s): TSH, T4TOTAL, T3FREE, THYROIDAB in the last 72 hours.  Invalid input(s): FREET3   Coagulation profile No results for input(s): INR, PROTIME in the last 168 hours. ------------------------------------------------------------------------------------------------------------------- No results for input(s): DDIMER in the last 72 hours. -------------------------------------------------------------------------------------------------------------------  Cardiac Enzymes No results for input(s): CKMB, TROPONINI, MYOGLOBIN in the last 168 hours.  Invalid input(s): CK ------------------------------------------------------------------------------------------------------------------ Invalid input(s): POCBNP  ---------------------------------------------------------------------------------------------------------------  Urinalysis No results found for: COLORURINE, APPEARANCEUR, LABSPEC, PHURINE, GLUCOSEU, HGBUR, BILIRUBINUR, KETONESUR, PROTEINUR, UROBILINOGEN, NITRITE, LEUKOCYTESUR   RADIOLOGY: Dg Chest 1 View  Result Date: 12/12/2016 CLINICAL DATA:  56 y/o  M; altered mental status. EXAM: CHEST 1 VIEW COMPARISON:  07/16/2007 chest radiograph. FINDINGS: Patient is rotated. Enlarged cardiac silhouette. The prominent pulmonary markings probably represents mild interstitial edema. No pleural effusion or pneumothorax. Bones are unremarkable. IMPRESSION: Cardiomegaly and interstitial pulmonary edema. Electronically Signed   By: Mitzi Hansen M.D.   On: 12/12/2016 22:00   Ct Head Wo Contrast  Result Date: 12/12/2016 CLINICAL DATA:  Patient got dizzy and fell and was found lying on the floor. Pain in the right side of the head. Right-sided gaze which is result of an old stroke. EXAM: CT HEAD WITHOUT CONTRAST TECHNIQUE: Contiguous axial images were  obtained from the base of the skull through the vertex without intravenous  contrast. COMPARISON:  07/16/2007 FINDINGS: Brain: Diffuse cerebral atrophy. Mild ventricular dilatation consistent with central atrophy. Low-attenuation changes in the deep white matter consistent with small vessel ischemia. There is loss of distinction of gray-white matter junction in the right posterior parietal and occipital region with some effacement of sulci. This may indicate changes of acute infarct. Consider MRI for further evaluation if clinically indicated. Wallerian degeneration of the right brainstem suggesting old infarcts. Old lacunar infarcts in the thalamus. No midline shift. No abnormal extra-axial fluid collections. Basal cisterns are not effaced. No acute intracranial hemorrhage. 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 Diameter of the aneurysm measures up to about 2 cm. This represents a significant increase from the previous study at which time it measured 9 mm. The aneurysm displaces the pons and midbrain towards the right and compresses the left cerebral peduncle. There is also an aneurysm of the basilar tip measuring about 7 mm diameter without change. Basilar artery is calcified and tortuous. Calcification of the internal carotid artery's as well. Skull: The calvarium appears intact. Sinuses/Orbits: Mild mucosal thickening in the paranasal sinuses. No acute air-fluid levels. Mastoid air cells are clear. Other: None. IMPRESSION: 1. Possible acute ischemic changes demonstrated in the right posterior parietal and occipital region. Consider MRI for further evaluation. 2. 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 Additional 7 mL aneurysm of the basilar tip. 3. No acute intracranial hemorrhage. 4. Chronic atrophy and small vessel ischemic  changes. Wallerian degeneration on the right suggesting old infarcts. Electronically Signed   By: Burman Nieves M.D.   On: 12/12/2016 22:46    EKG: Orders placed or performed in visit on 07/16/07  . EKG 12-Lead    IMPRESSION AND PLAN: 56 year old male patient with history of CVA, left-sided weakness, hypertension, diabetes mellitus type 2 presented to the emergency room for fall and increased left-sided weakness.  Admitting diagnosis 1. Right posterior parietal and occipital ischemic CVA 2. Hypertensive urgency 3. Basilar artery aneurysm 4. Diabetes mellitus type 2 Treatment plan Admit patient to medical floor Neurochecks every 2 hourly Neurology consultation Start patient on aspirin 325 MG orally daily Control blood pressure with oral norvasc and ACE inhibitor MRI and MRA brain to assess for CVA Carotid ultrasound to rule out obstruction DVT prophylaxis sequential compression devices lower extremities Check echocardiogram  All the records are reviewed and case discussed with ED provider. Management plans discussed with the patient, family and they are in agreement.  CODE STATUS:FULL CODE    Code Status Orders        Start     Ordered   12/13/16 0132  Full code  Continuous     12/13/16 0131    Code Status History    Date Active Date Inactive Code Status Order ID Comments User Context   This patient has a current code status but no historical code status.       TOTAL TIME TAKING CARE OF THIS PATIENT: 54 minutes.    Ihor Austin M.D on 12/13/2016 at 1:59 AM  Between 7am to 6pm - Pager - [phone]  After 6pm go to [email or web address] - password [email or web address]  [facility]  Office  [phone]  CC: Primary care physician; [facility]

## 2016-12-30 DEATH — deceased

## 2017-06-24 LAB — HIV ANTIBODY (ROUTINE TESTING W REFLEX): HIV SCREEN 4TH GENERATION: NONREACTIVE

## 2018-08-24 IMAGING — US US CAROTID DUPLEX BILAT
1 series · 13 of 21 positions shown · non-contrast
Comparison: None.

CLINICAL DATA: Stroke. Hypertension. Syncope. Hyperlipidemia.
Diabetes.

EXAM:
BILATERAL CAROTID DUPLEX ULTRASOUND
TECHNIQUE: Gray scale imaging, color Doppler and duplex ultrasound were
performed of bilateral carotid and vertebral arteries in the neck.

[Series 1: us carotid duplex bilat · 13 of 21 slices shown]
[im 1/21]
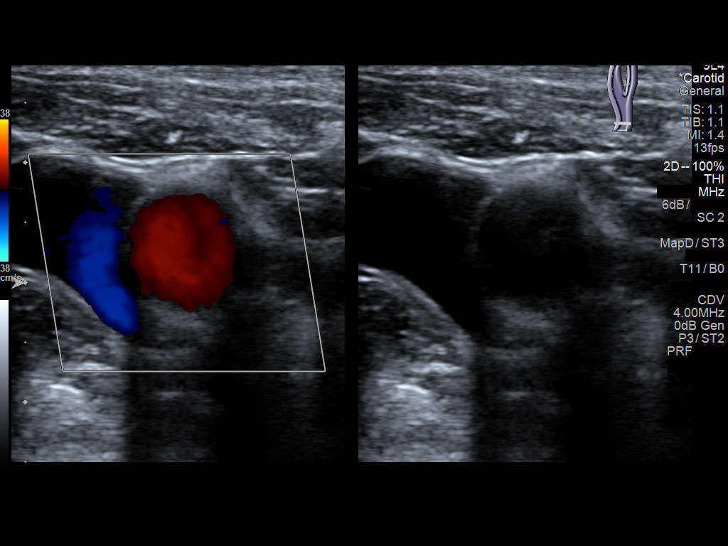
[im 3/21]
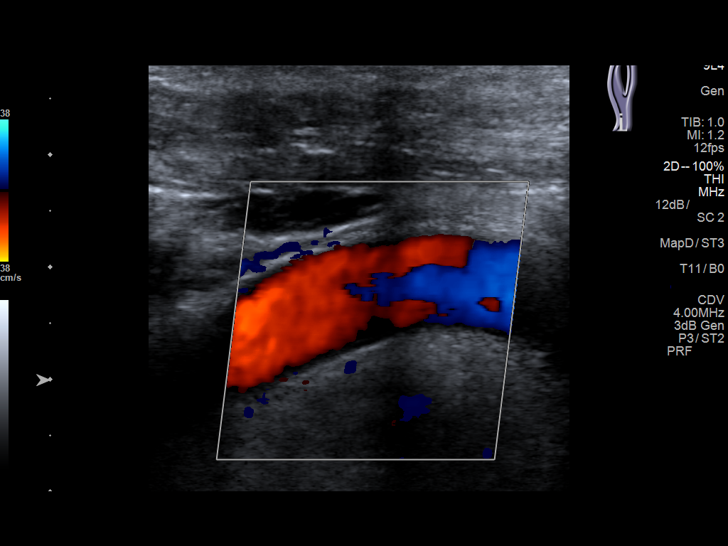
[im 5/21]
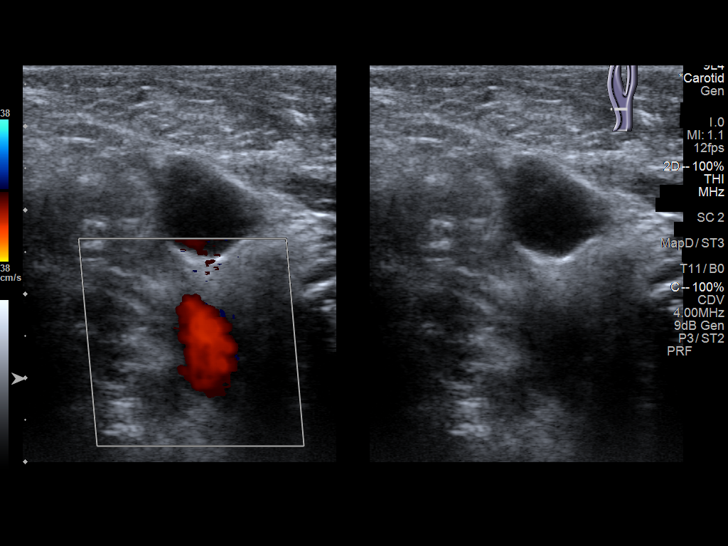
[im 6/21]
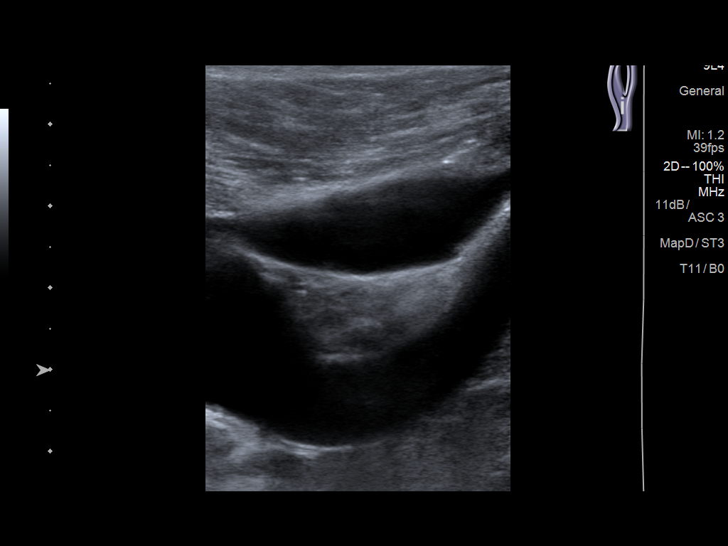
[im 8/21]
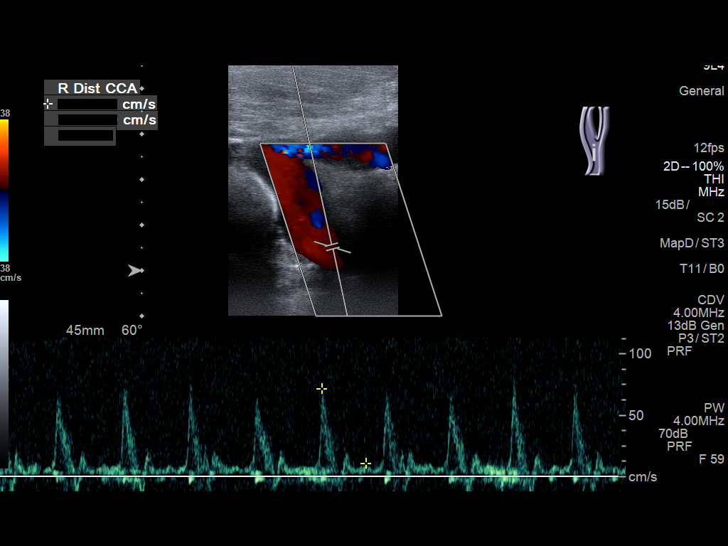
[im 9/21]
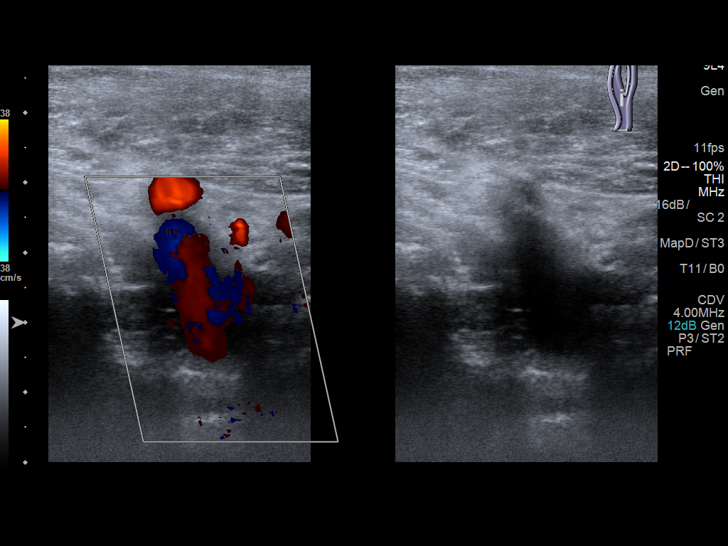
[im 11/21]
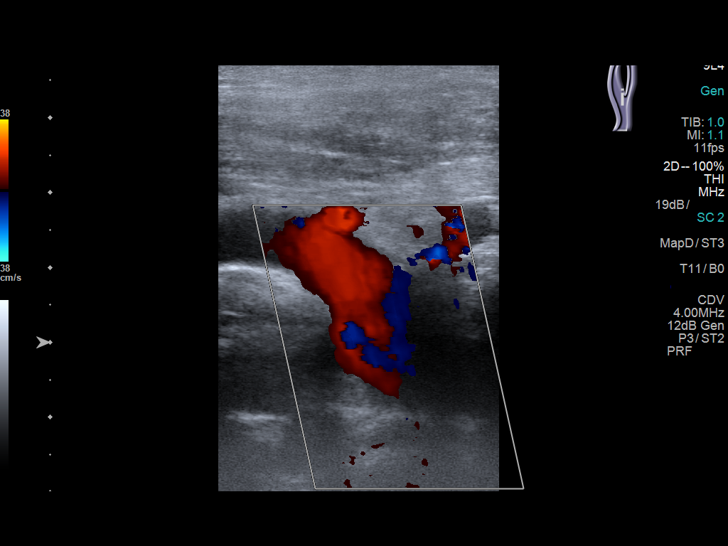
[im 13/21]
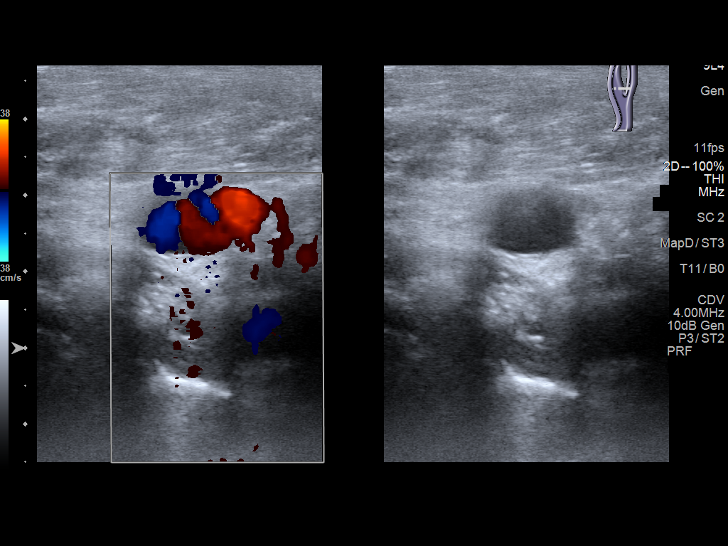
[im 14/21]
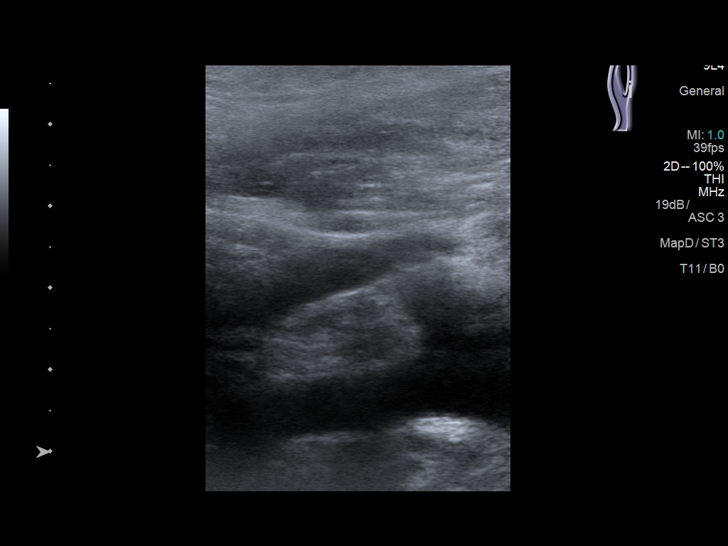
[im 16/21]
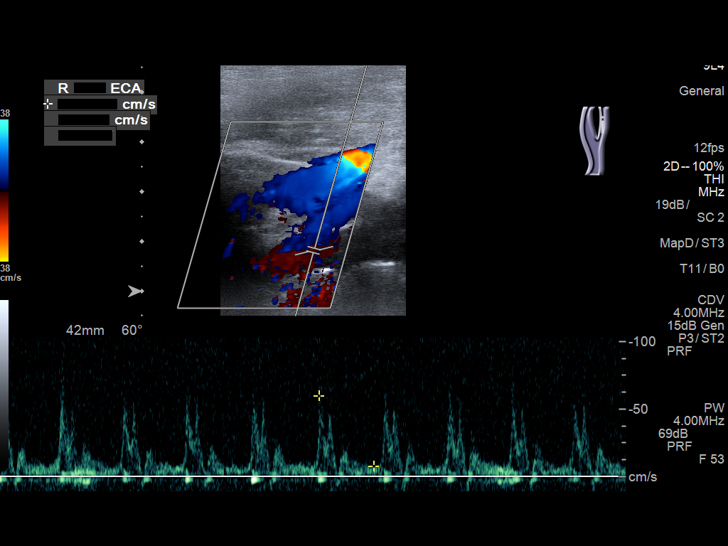
[im 17/21]
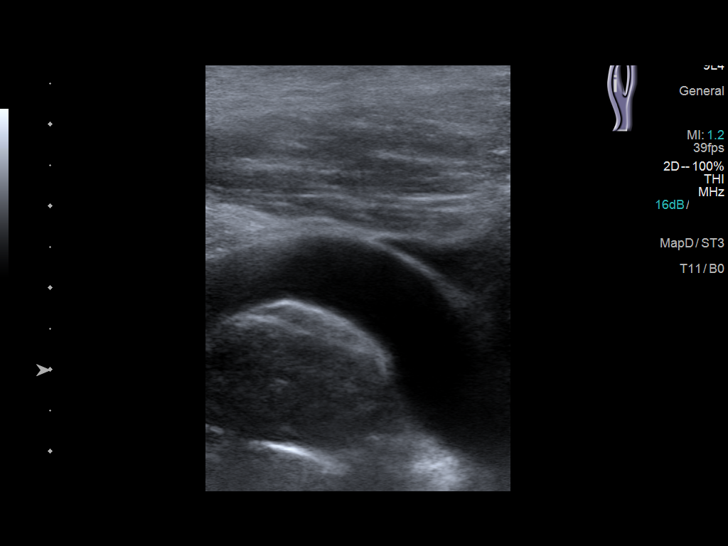
[im 19/21]
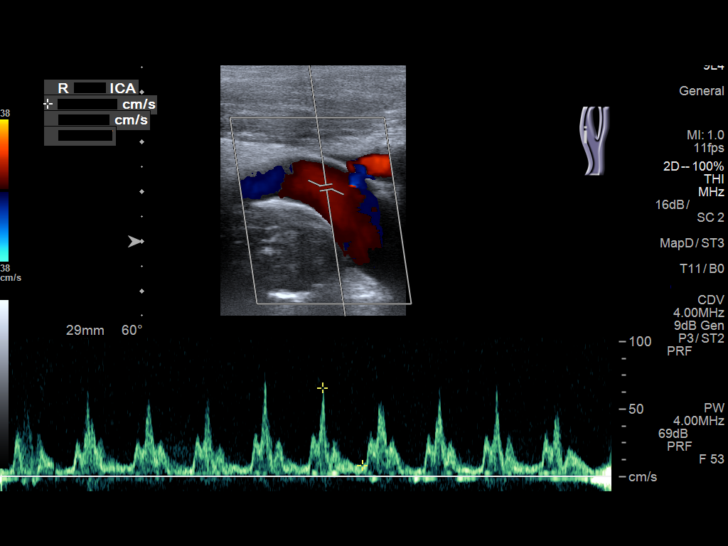
[im 21/21]
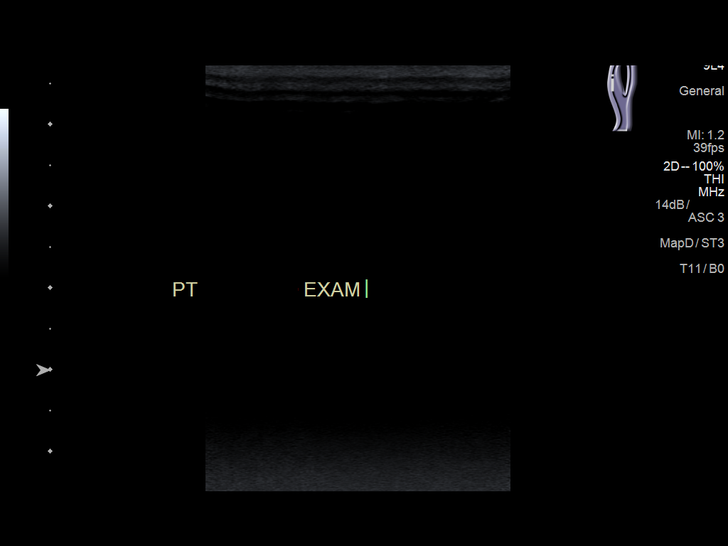

[13 of 21 positions shown; findings below may reference images not displayed]

FINDINGS: Criteria: Quantification of carotid stenosis is based on velocity
parameters that correlate the residual internal carotid diameter
with NASCET-based stenosis levels, using the diameter of the distal
internal carotid lumen as the denominator for stenosis measurement.

The following velocity measurements were obtained:

RIGHT

ICA:  66/8 cm/sec

CCA:  96/9 cm/sec

SYSTOLIC ICA/CCA RATIO:

DIASTOLIC ICA/CCA RATIO:

ECA:  60 cm/sec

LEFT

The patient terminated the examination. The left carotid circulation
was not evaluated.

RIGHT CAROTID ARTERY: Tortuous right carotid artery. Normal
appearance of the carotid bifurcation. No significant calcific
plaque formation. Flow is demonstrated throughout the carotid
bifurcation on color flow Doppler imaging without significant
turbulence. Normal waveforms are obtained. No findings to suggest
significant stenosis.

RIGHT VERTEBRAL ARTERY: Right vertebral artery was not evaluated due
to patient terminating the examination.

LEFT CAROTID ARTERY:  Not evaluated.

LEFT VERTEBRAL ARTERY:  Not evaluated.
IMPRESSION: Examination was limited to the right carotid circulation due to
patient terminated the examination prior to completion.

No evidence of hemodynamically significant stenosis of the right
internal carotid artery.

## 2019-02-10 IMAGING — CT CT HEAD W/O CM
3 series · 14 of 47 positions shown, 16 images · non-contrast
Comparison: 07/16/2007

CLINICAL DATA: Patient got dizzy and fell and was found lying on
the floor. Pain in the right side of the head. Right-sided gaze
which is result of an old stroke.

EXAM:
CT HEAD WITHOUT CONTRAST
TECHNIQUE: Contiguous axial images were obtained from the base of the skull
through the vertex without intravenous contrast.

[Series 2: head wo · axial · 0.47mm/px · z∈[-140,-15]mm · 8 of 31 slices shown, 10 images]
[im 3/31  brain]
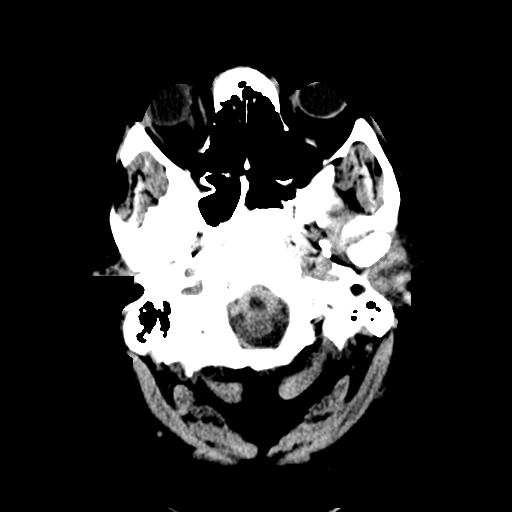
[im 3/31  bone]
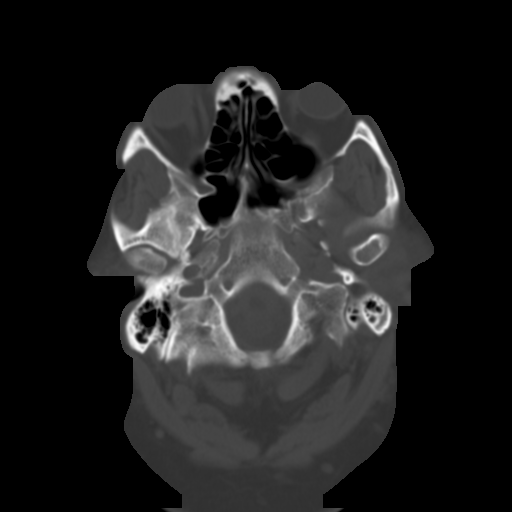
[im 7/31  brain]
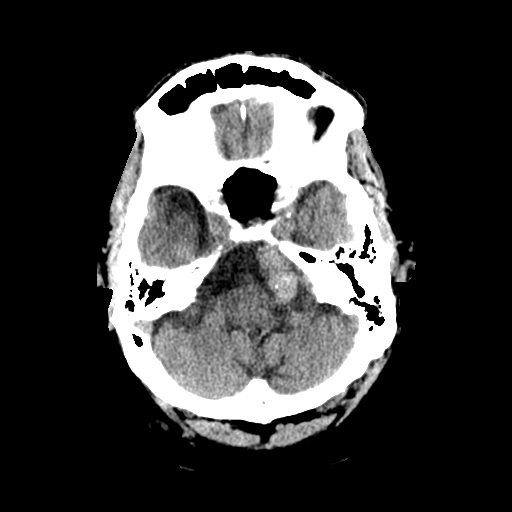
[im 10/31  brain]
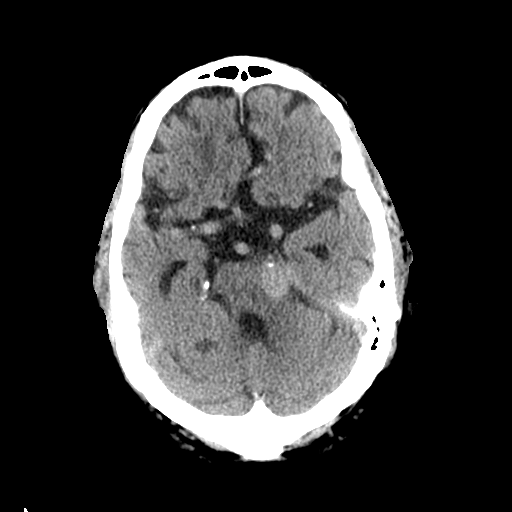
[im 14/31  brain]
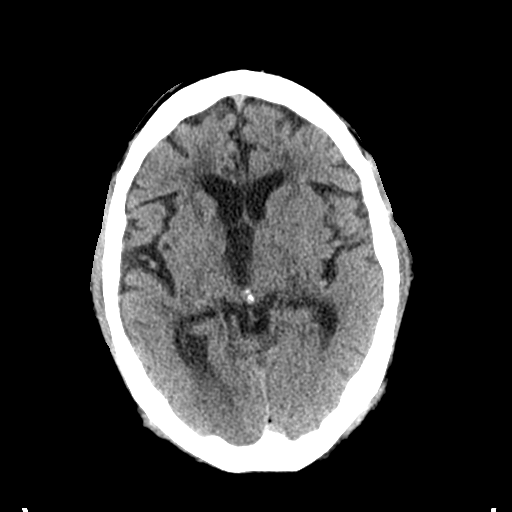
[im 17/31  brain]
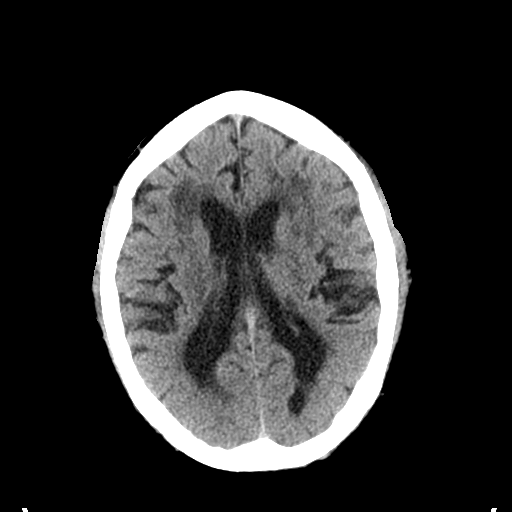
[im 17/31  bone]
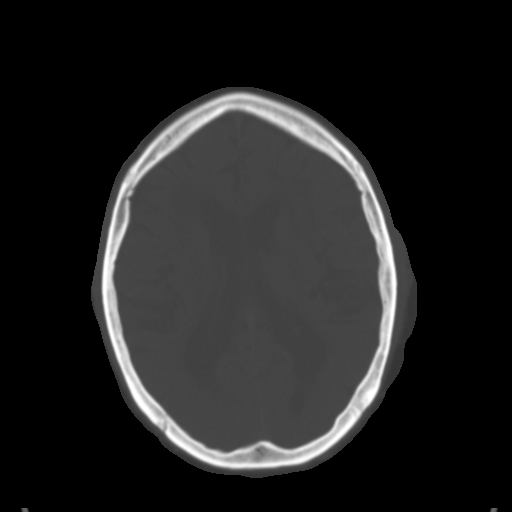
[im 21/31  brain]
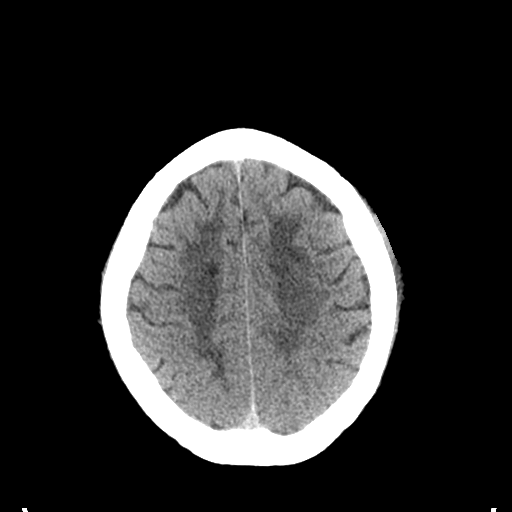
[im 24/31  brain]
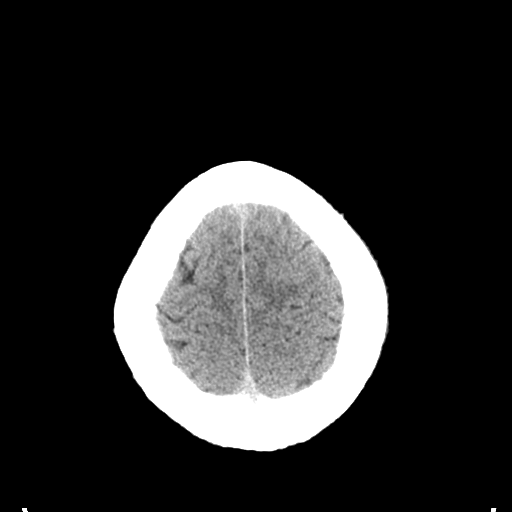
[im 28/31  brain]
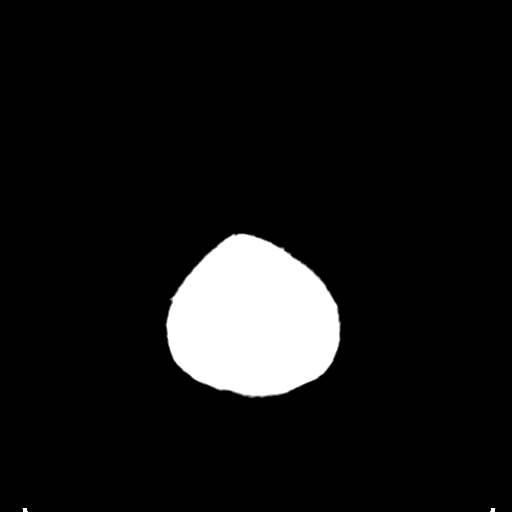

[Series 4: coronal soft tissue · coronal · 0.29mm/px · 3 of 67 slices shown]
[im 23/67  brain]
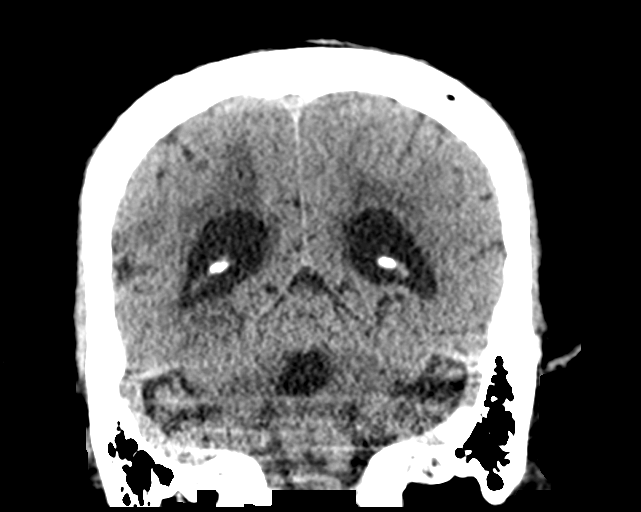
[im 30/67  brain]
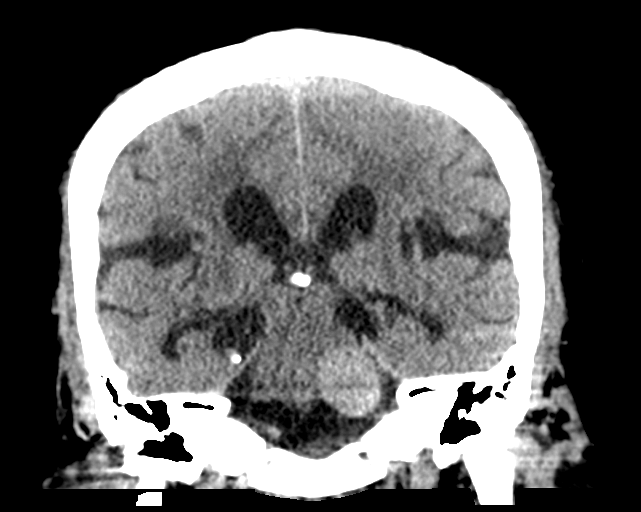
[im 37/67  brain]
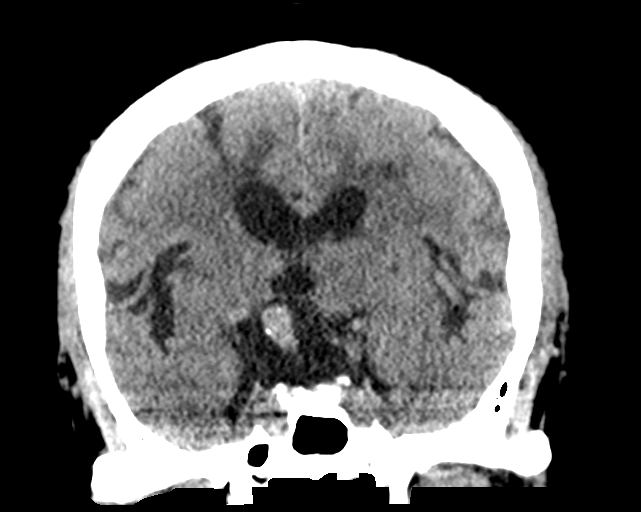

[Series 5: sagittal soft tissue · sagittal · 0.33mm/px · 3 of 55 slices shown]
[im 19/55  brain]
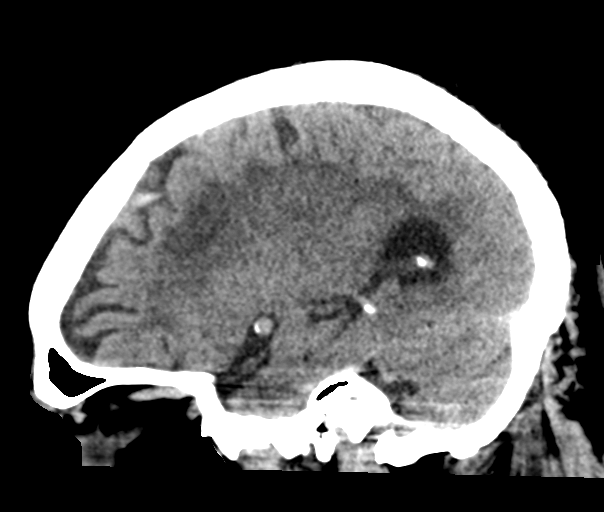
[im 28/55  brain]
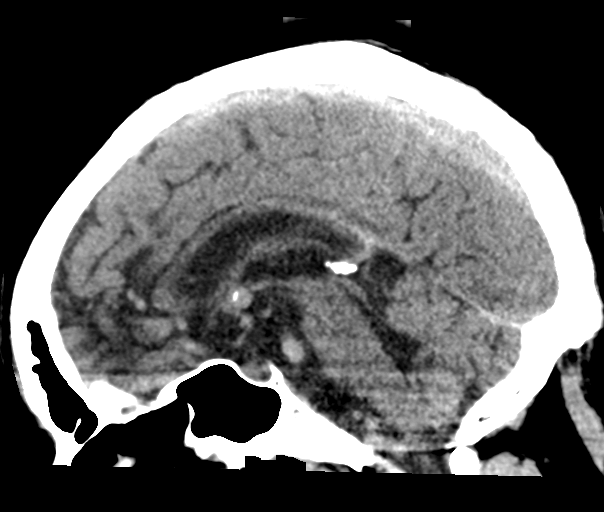
[im 37/55  brain]
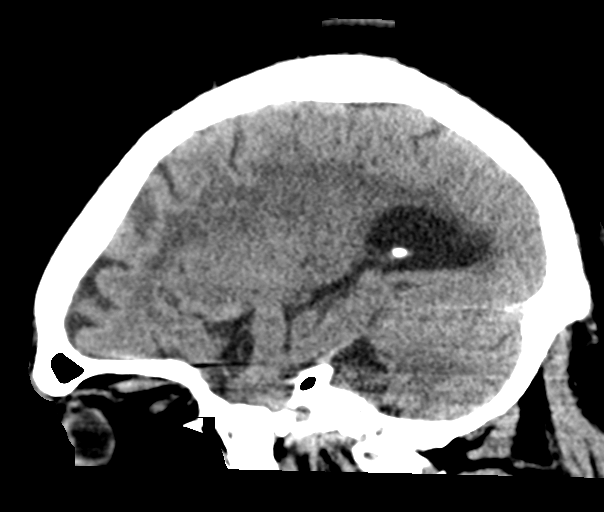

[14 of 47 positions shown; findings below may reference images not displayed]

FINDINGS: Brain: Diffuse cerebral atrophy. Mild ventricular dilatation
consistent with central atrophy. Low-attenuation changes in the deep
white matter consistent with small vessel ischemia. There is loss of
distinction of gray-white matter junction in the right posterior
parietal and occipital region with some effacement of sulci. This
may indicate changes of acute infarct. Consider MRI for further
evaluation if clinically indicated. Wallerian degeneration of the
right brainstem suggesting old infarcts. Old lacunar infarcts in the
thalamus. No midline shift. No abnormal extra-axial fluid
collections. Basal cisterns are not effaced. No acute intracranial
hemorrhage.

Vascular: There is diffuse dilatation of the basilar artery with a a
large aneurysm of the proximal/ mid basilar artery. Diameter of the
aneurysm measures up to about 2 cm. This represents a significant
increase from the previous study at which time it measured 9 mm. The
aneurysm displaces the pons and midbrain towards the right and
compresses the left cerebral peduncle. There is also an aneurysm of
the basilar tip measuring about 7 mm diameter without change.
Basilar artery is calcified and tortuous. Calcification of the
internal carotid artery's as well.

Skull: The calvarium appears intact.

Sinuses/Orbits: Mild mucosal thickening in the paranasal sinuses. No
acute air-fluid levels. Mastoid air cells are clear.

Other: None.
IMPRESSION: 1. Possible acute ischemic changes demonstrated in the right
posterior parietal and occipital region. Consider MRI for further
evaluation.
2. Large aneurysm of the midportion of the basilar artery, measuring
up to 2 cm diameter, with significant increase in size since
previous study. Additional 7 mL aneurysm of the basilar tip.
3. No acute intracranial hemorrhage.
4. Chronic atrophy and small vessel ischemic changes. Wallerian
degeneration on the right suggesting old infarcts.

## 2019-02-11 IMAGING — CR DG ORBITS FOR FOREIGN BODY
2 series · 2 of 2 positions shown · non-contrast
Comparison: None.

CLINICAL DATA: Metal working/exposure; clearance prior to MRI

EXAM:
ORBITS FOR FOREIGN BODY - 2 VIEW

[orbits waters (1 of 2)]
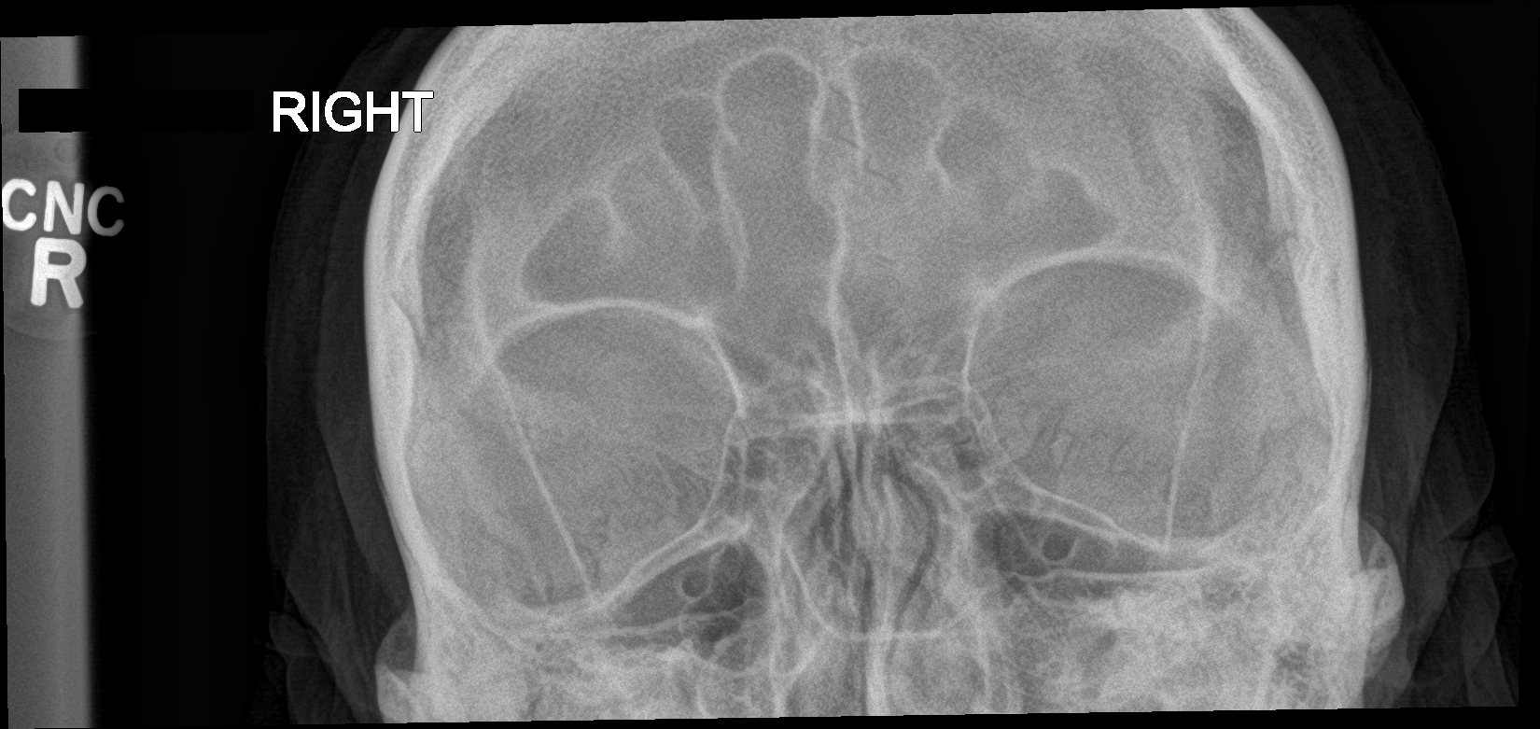

[orbits waters (2 of 2)]
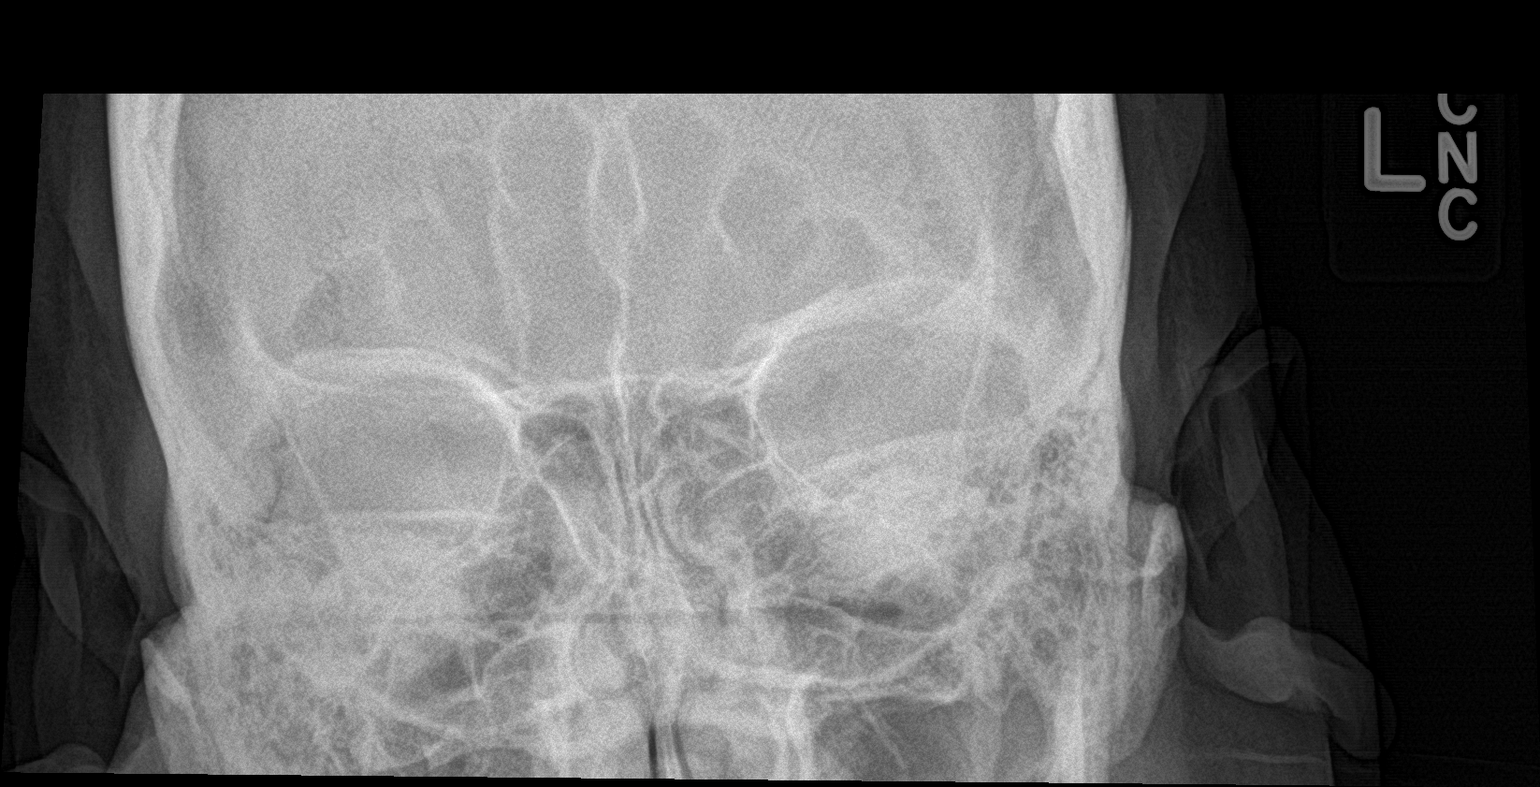

[2 of 2 positions shown; findings below may reference images not displayed]

FINDINGS: There is no evidence of metallic foreign body within the orbits. No
significant bone abnormality identified.
IMPRESSION: No evidence of metallic foreign body within the orbits.
